# Patient Record
Sex: Female | Born: 1970 | Race: Black or African American | Hispanic: No | Marital: Married | Smoking: Never smoker
Health system: Southern US, Community
[De-identification: ages and names within clinical notes are randomized; demographics above are authoritative.]

## PROBLEM LIST (undated history)

## (undated) DIAGNOSIS — R42 Dizziness and giddiness: Secondary | ICD-10-CM

## (undated) DIAGNOSIS — C50919 Malignant neoplasm of unspecified site of unspecified female breast: Secondary | ICD-10-CM

## (undated) DIAGNOSIS — I10 Essential (primary) hypertension: Secondary | ICD-10-CM

## (undated) DIAGNOSIS — D649 Anemia, unspecified: Secondary | ICD-10-CM

## (undated) HISTORY — PX: LAPAROSCOPIC GASTRIC SLEEVE RESECTION: SHX5895

## (undated) HISTORY — PX: KNEE SURGERY: SHX244

## (undated) HISTORY — PX: BREAST LUMPECTOMY: SHX2

## (undated) HISTORY — PX: TONSILLECTOMY: SUR1361

---

## 2011-12-13 ENCOUNTER — Encounter (HOSPITAL_BASED_OUTPATIENT_CLINIC_OR_DEPARTMENT_OTHER): Payer: Self-pay | Admitting: *Deleted

## 2011-12-13 ENCOUNTER — Emergency Department (HOSPITAL_BASED_OUTPATIENT_CLINIC_OR_DEPARTMENT_OTHER)
Admission: EM | Admit: 2011-12-13 | Discharge: 2011-12-14 | Disposition: A | Discharge status: Home / Self Care | Payer: Self-pay | Attending: Emergency Medicine | Admitting: Emergency Medicine

## 2011-12-13 DIAGNOSIS — R209 Unspecified disturbances of skin sensation: Secondary | ICD-10-CM | POA: Insufficient documentation

## 2011-12-13 DIAGNOSIS — K089 Disorder of teeth and supporting structures, unspecified: Secondary | ICD-10-CM | POA: Insufficient documentation

## 2011-12-13 DIAGNOSIS — K0889 Other specified disorders of teeth and supporting structures: Secondary | ICD-10-CM

## 2011-12-13 DIAGNOSIS — I10 Essential (primary) hypertension: Secondary | ICD-10-CM | POA: Insufficient documentation

## 2011-12-13 HISTORY — DX: Essential (primary) hypertension: I10

## 2011-12-13 NOTE — ED Notes (Signed)
Pt c/o tooth pain in upper left that started tonight. Also c/o intermittent right hand numbness that started this morning

## 2011-12-14 ENCOUNTER — Encounter (HOSPITAL_BASED_OUTPATIENT_CLINIC_OR_DEPARTMENT_OTHER): Payer: Self-pay | Admitting: Emergency Medicine

## 2011-12-14 MED ORDER — OXYCODONE-ACETAMINOPHEN 5-325 MG PO TABS: 1.0000 | ORAL_TABLET | ORAL | Status: AC | PRN

## 2011-12-14 MED ORDER — OXYCODONE-ACETAMINOPHEN 5-325 MG PO TABS
1.0000 | ORAL_TABLET | Freq: Once | ORAL | Status: AC
  Administered 2011-12-14: 1 via ORAL
  Filled 2011-12-14: qty 1

## 2011-12-14 MED ORDER — PENICILLIN V POTASSIUM 500 MG PO TABS: 500.0000 mg | ORAL_TABLET | Freq: Four times a day (QID) | ORAL | Status: AC

## 2011-12-14 MED ORDER — PENICILLIN V POTASSIUM 250 MG PO TABS
500.0000 mg | ORAL_TABLET | Freq: Four times a day (QID) | ORAL | Status: DC
  Administered 2011-12-14: 500 mg via ORAL
  Filled 2011-12-14: qty 2

## 2011-12-14 NOTE — Discharge Instructions (Signed)
Toothache Toothaches are usually caused by tooth decay (cavity). However, other causes of toothache include:  Gum disease.   Cracked tooth.   Cracked filling.   Injury.   Jaw problem (temporo mandibular joint or TMJ disorder).   Tooth abscess.   Root sensitivity.   Grinding.   Eruption problems.  Swelling and redness around a painful tooth often means you have a dental abscess. Pain medicine and antibiotics can help reduce symptoms, but you will need to see a dentist within the next few days to have your problem properly evaluated and treated. If tooth decay is the problem, you may need a filling or root canal to save your tooth. If the problem is more severe, your tooth may need to be pulled. SEEK IMMEDIATE MEDICAL CARE IF:  You cannot swallow.   You develop severe swelling, increased redness, or increased pain in your mouth or face.   You have a fever.   You cannot open your mouth adequately.  Document Released: 09/19/2004 Document Revised: 08/01/2011 Document Reviewed: 11/09/2009 [employer] Patient Information 2012 [employer]. 

## 2011-12-14 NOTE — ED Provider Notes (Signed)
History     CSN: [id number]  Arrival date & time 12/13/11  2308   First MD Initiated Contact with Patient 12/14/11 0024      Chief Complaint  Patient presents with  . Dental Pain    (Consider location/radiation/quality/duration/timing/severity/associated sxs/prior treatment) Patient is a 41 y.o. female presenting with tooth pain. The history is provided by the patient. No language interpreter was used.  Dental PainThe primary symptoms include mouth pain. Primary symptoms do not include fever. The symptoms began 12 to 24 hours ago (She has a severely decayed left upper second bicuspid tooth that has become painful and swollen today.  She had been previously advised that this tooth would need to be extracted.). The symptoms are worsening. The symptoms occur constantly.  Additional symptoms include: dental sensitivity to temperature, gum swelling and gum tenderness.    Past Medical History  Diagnosis Date  . Hypertension     Past Surgical History  Procedure Date  . Tonsillectomy     No family history on file.  History  Substance Use Topics  . Smoking status: Never Smoker   . Smokeless tobacco: Not on file  . Alcohol Use: No    OB History    Grav Para Term Preterm Abortions TAB SAB Ect Mult Living                  Review of Systems  Constitutional: Negative for fever and chills.  HENT: Positive for dental problem.   Eyes: Negative.   Respiratory: Negative.   Cardiovascular:       History of hypertension.  Gastrointestinal: Negative.   Genitourinary: Negative.   Musculoskeletal: Negative.   Skin: Negative.   Neurological: Positive for numbness.       She has noted some numbness in the right hand today.  Psychiatric/Behavioral: Negative.     Allergies  Review of patient's allergies indicates no known allergies.  Home Medications   Current Outpatient Rx  Name Route Sig Dispense Refill  . OXYCODONE-ACETAMINOPHEN 5-325 MG PO TABS Oral Take 1 tablet by mouth  every 4 (four) hours as needed for pain. 20 tablet 0  . PENICILLIN V POTASSIUM 500 MG PO TABS Oral Take 1 tablet (500 mg total) by mouth 4 (four) times daily. 40 tablet 0    BP 170/115  Pulse 106  Temp(Src) 98.8 F (37.1 C) (Oral)  Resp 16  SpO2 100%  LMP 12/02/2011  Physical Exam  Nursing note and vitals reviewed. Constitutional: She is oriented to person, place, and time.       In moderate distress with dental pain; noted to be hypertensive.  HENT:  Head: Normocephalic and atraumatic.  Right Ear: External ear normal.  Left Ear: External ear normal.  Nose: Nose normal.       She has a severely decayed left upper second bicuspid tooth, decayed down to the gum line.  The gingiva around this tooth is swollen and red and tender to palpation.   Eyes: Conjunctivae and EOM are normal. Pupils are equal, round, and reactive to light.  Neck: Normal range of motion. Neck supple.  Cardiovascular: Normal rate, regular rhythm and normal heart sounds.   Pulmonary/Chest: Effort normal and breath sounds normal.  Abdominal: Soft. There is no tenderness.  Musculoskeletal: Normal range of motion.  Neurological: She is alert and oriented to person, place, and time.       No sensory or motor deficit.  No objective sensory loss in right hand.  Skin: Skin is warm  and dry.  Psychiatric: She has a normal mood and affect. Her behavior is normal.    ED Course  Procedures (including critical care time)  Labs Reviewed - No data to display No results found.   1. Toothache      DISP:  I advised pt to take PenVK 500 mg qid x 10 days and Percocet q4h prn pain.  She will need dental extraction of her severely decayed tooth.       Carleene Cooper III, MD 12/14/11 1311

## 2014-02-28 ENCOUNTER — Encounter (HOSPITAL_BASED_OUTPATIENT_CLINIC_OR_DEPARTMENT_OTHER): Payer: Self-pay | Admitting: Emergency Medicine

## 2014-02-28 ENCOUNTER — Emergency Department (HOSPITAL_BASED_OUTPATIENT_CLINIC_OR_DEPARTMENT_OTHER): Payer: BC Managed Care – PPO

## 2014-02-28 ENCOUNTER — Emergency Department (HOSPITAL_BASED_OUTPATIENT_CLINIC_OR_DEPARTMENT_OTHER)
Admission: EM | Admit: 2014-02-28 | Discharge: 2014-02-28 | Disposition: A | Discharge status: Home / Self Care | Payer: BC Managed Care – PPO | Attending: Emergency Medicine | Admitting: Emergency Medicine

## 2014-02-28 DIAGNOSIS — R0789 Other chest pain: Secondary | ICD-10-CM | POA: Insufficient documentation

## 2014-02-28 DIAGNOSIS — Z79899 Other long term (current) drug therapy: Secondary | ICD-10-CM | POA: Insufficient documentation

## 2014-02-28 DIAGNOSIS — E669 Obesity, unspecified: Secondary | ICD-10-CM | POA: Insufficient documentation

## 2014-02-28 DIAGNOSIS — I10 Essential (primary) hypertension: Secondary | ICD-10-CM | POA: Insufficient documentation

## 2014-02-28 DIAGNOSIS — R079 Chest pain, unspecified: Secondary | ICD-10-CM

## 2014-02-28 LAB — COMPREHENSIVE METABOLIC PANEL
ALBUMIN: 3.9 g/dL (ref 3.5–5.2)
ALT: 11 U/L (ref 0–35)
AST: 17 U/L (ref 0–37)
Alkaline Phosphatase: 88 U/L (ref 39–117)
Anion gap: 13 (ref 5–15)
BUN: 14 mg/dL (ref 6–23)
CALCIUM: 8.8 mg/dL (ref 8.4–10.5)
CO2: 23 mEq/L (ref 19–32)
CREATININE: 1 mg/dL (ref 0.50–1.10)
Chloride: 106 mEq/L (ref 96–112)
GFR calc Af Amer: 78 mL/min — ABNORMAL LOW (ref >90)
GFR calc non Af Amer: 67 mL/min — ABNORMAL LOW (ref >90)
Glucose, Bld: 108 mg/dL — ABNORMAL HIGH (ref 70–99)
Potassium: 2.9 mEq/L — CL (ref 3.7–5.3)
Sodium: 142 mEq/L (ref 137–147)
Total Bilirubin: 0.5 mg/dL (ref 0.3–1.2)
Total Protein: 7.4 g/dL (ref 6.0–8.3)

## 2014-02-28 LAB — CBC WITH DIFFERENTIAL/PLATELET
BASOS ABS: 0 10*3/uL (ref 0.0–0.1)
BASOS PCT: 0 % (ref 0–1)
EOS ABS: 0.2 10*3/uL (ref 0.0–0.7)
EOS PCT: 2 % (ref 0–5)
HEMATOCRIT: 32.9 % — AB (ref 36.0–46.0)
Hemoglobin: 10.8 g/dL — ABNORMAL LOW (ref 12.0–15.0)
Lymphocytes Relative: 24 % (ref 12–46)
Lymphs Abs: 2.1 10*3/uL (ref 0.7–4.0)
MCH: 27.5 pg (ref 26.0–34.0)
MCHC: 32.8 g/dL (ref 30.0–36.0)
MCV: 83.7 fL (ref 78.0–100.0)
MONO ABS: 0.6 10*3/uL (ref 0.1–1.0)
Monocytes Relative: 7 % (ref 3–12)
Neutro Abs: 5.7 10*3/uL (ref 1.7–7.7)
Neutrophils Relative %: 66 % (ref 43–77)
Platelets: 241 10*3/uL (ref 150–400)
RBC: 3.93 MIL/uL (ref 3.87–5.11)
RDW: 15.6 % — AB (ref 11.5–15.5)
WBC: 8.6 10*3/uL (ref 4.0–10.5)

## 2014-02-28 LAB — TROPONIN I

## 2014-02-28 MED ORDER — POTASSIUM CHLORIDE CRYS ER 20 MEQ PO TBCR
40.0000 meq | EXTENDED_RELEASE_TABLET | Freq: Once | ORAL | Status: AC
  Administered 2014-02-28: 40 meq via ORAL
  Filled 2014-02-28: qty 2

## 2014-02-28 NOTE — ED Notes (Signed)
Right arm weakness and chest heaviness that started this am.  She reports arm weakness has been intermittent x 2 months.

## 2014-02-28 NOTE — ED Notes (Signed)
MD at bedside. 

## 2014-02-28 NOTE — ED Provider Notes (Signed)
CSN: [id number]     Arrival date & time 02/28/14  0845 History   First MD Initiated Contact with Patient 02/28/14 [phone]     Chief Complaint  Patient presents with  . Extremity Weakness     (Consider location/radiation/quality/duration/timing/severity/associated sxs/prior Treatment) HPI Comments: Patient is a 43 year old female with past medical history of obesity and hypertension. She presents today with complaints of heaviness in her chest that started approximately 2 hours prior to arrival. She denies any shortness of breath, nausea, diaphoresis, or radiation to the arm or jaw. She denies any exertional symptoms. She does not smoke and has no family history of cardiac disease.  The history is provided by the patient.    Past Medical History  Diagnosis Date  . Hypertension    Past Surgical History  Procedure Laterality Date  . Tonsillectomy     No family history on file. History  Substance Use Topics  . Smoking status: Never Smoker   . Smokeless tobacco: Not on file  . Alcohol Use: No   OB History   Grav Para Term Preterm Abortions TAB SAB Ect Mult Living                 Review of Systems  All other systems reviewed and are negative.     Allergies  Review of patient's allergies indicates no known allergies.  Home Medications   Prior to Admission medications   Medication Sig Start Date End Date Taking? Authorizing Provider  triamterene-hydrochlorothiazide (DYAZIDE) 37.5-25 MG per capsule Take 1 capsule by mouth daily.   Yes Historical Provider, MD  hydrochlorothiazide (HYDRODIURIL) 25 MG tablet Take 25 mg by mouth daily.    Historical Provider, MD  metoprolol succinate (TOPROL-XL) 50 MG 24 hr tablet Take 50 mg by mouth daily. Take with or immediately following a meal.    Historical Provider, MD   BP 173/107  Pulse 89  Temp(Src) 98.7 F (37.1 C) (Oral)  Resp 18  Ht 5\' 1"  (1.549 m)  Wt 267 lb (121.11 kg)  BMI 50.48 kg/m2  SpO2 99%  LMP 02/26/2014 Physical  Exam  Nursing note and vitals reviewed. Constitutional: She is oriented to person, place, and time. She appears well-developed and well-nourished. No distress.  HENT:  Head: Normocephalic and atraumatic.  Neck: Normal range of motion. Neck supple.  Cardiovascular: Normal rate and regular rhythm.  Exam reveals no gallop and no friction rub.   No murmur heard. Pulmonary/Chest: Effort normal and breath sounds normal. No respiratory distress. She has no wheezes.  Abdominal: Soft. Bowel sounds are normal. She exhibits no distension. There is no tenderness.  Musculoskeletal: Normal range of motion.  Neurological: She is alert and oriented to person, place, and time.  Skin: Skin is warm and dry. She is not diaphoretic.    ED Course  Procedures (including critical care time) Labs Review Labs Reviewed - No data to display  Imaging Review No results found.   EKG Interpretation   Date/Time:  Monday February 28 2014 09:12:06 EDT Ventricular Rate:  87 PR Interval:  168 QRS Duration: 94 QT Interval:  392 QTC Calculation: 471 R Axis:   3 Text Interpretation:  Normal sinus rhythm Normal ECG Confirmed by DELOS   MD, Vincen Bejar ([id number]) on 02/28/2014 9:18:25 AM      MDM   Final diagnoses:  None    Patient is a 43 year old female who presents with complaints of heaviness in her chest that started this morning. Her EKG is normal and initial troponin  is negative. Her symptoms are atypical for cardiac pain and I strongly doubt a cardiac etiology. She has no significant family history, does not smoke, and her only risk factor is hypertension. She is having no exertional symptoms.  I suspect a musculoskeletal etiology, or other noncardiac etiology. She will be discharged to home and advised to followup with her primary Dr. if not improving in the next several days. She is to return if her symptoms substantially worsen or change.    Veryl Speak, MD 02/28/14 [phone]

## 2014-02-28 NOTE — Discharge Instructions (Signed)
Ibuprofen 600 mg every 6 hours as needed for pain.  Follow up with your primary Dr. if not improving in the next several days and return to the ER if your symptoms substantially worsen or change.   Chest Pain (Nonspecific) It is often hard to give a specific diagnosis for the cause of chest pain. There is always a chance that your pain could be related to something serious, such as a heart attack or a blood clot in the lungs. You need to follow up with your health care provider for further evaluation. CAUSES   Heartburn.  Pneumonia or bronchitis.  Anxiety or stress.  Inflammation around your heart (pericarditis) or lung (pleuritis or pleurisy).  A blood clot in the lung.  A collapsed lung (pneumothorax). It can develop suddenly on its own (spontaneous pneumothorax) or from trauma to the chest.  Shingles infection (herpes zoster virus). The chest wall is composed of bones, muscles, and cartilage. Any of these can be the source of the pain.  The bones can be bruised by injury.  The muscles or cartilage can be strained by coughing or overwork.  The cartilage can be affected by inflammation and become sore (costochondritis). DIAGNOSIS  Lab tests or other studies may be needed to find the cause of your pain. Your health care provider may have you take a test called an ambulatory electrocardiogram (ECG). An ECG records your heartbeat patterns over a 24-hour period. You may also have other tests, such as:  Transthoracic echocardiogram (TTE). During echocardiography, sound waves are used to evaluate how blood flows through your heart.  Transesophageal echocardiogram (TEE).  Cardiac monitoring. This allows your health care provider to monitor your heart rate and rhythm in real time.  Holter monitor. This is a portable device that records your heartbeat and can help diagnose heart arrhythmias. It allows your health care provider to track your heart activity for several days, if  needed.  Stress tests by exercise or by giving medicine that makes the heart beat faster. TREATMENT   Treatment depends on what may be causing your chest pain. Treatment may include:  Acid blockers for heartburn.  Anti-inflammatory medicine.  Pain medicine for inflammatory conditions.  Antibiotics if an infection is present.  You may be advised to change lifestyle habits. This includes stopping smoking and avoiding alcohol, caffeine, and chocolate.  You may be advised to keep your head raised (elevated) when sleeping. This reduces the chance of acid going backward from your stomach into your esophagus. Most of the time, nonspecific chest pain will improve within 2-3 days with rest and mild pain medicine.  HOME CARE INSTRUCTIONS   If antibiotics were prescribed, take them as directed. Finish them even if you start to feel better.  For the next few days, avoid physical activities that bring on chest pain. Continue physical activities as directed.  Do not use any tobacco products, including cigarettes, chewing tobacco, or electronic cigarettes.  Avoid drinking alcohol.  Only take medicine as directed by your health care provider.  Follow your health care provider's suggestions for further testing if your chest pain does not go away.  Keep any follow-up appointments you made. If you do not go to an appointment, you could develop lasting (chronic) problems with pain. If there is any problem keeping an appointment, call to reschedule. SEEK MEDICAL CARE IF:   Your chest pain does not go away, even after treatment.  You have a rash with blisters on your chest.  You have a fever.  SEEK IMMEDIATE MEDICAL CARE IF:   You have increased chest pain or pain that spreads to your arm, neck, jaw, back, or abdomen.  You have shortness of breath.  You have an increasing cough, or you cough up blood.  You have severe back or abdominal pain.  You feel nauseous or vomit.  You have severe  weakness.  You faint.  You have chills. This is an emergency. Do not wait to see if the pain will go away. Get medical help at once. Call your local emergency services (911 in [place] Do not drive yourself to the hospital. MAKE SURE YOU:   Understand these instructions.  Will watch your condition.  Will get help right away if you are not doing well or get worse. Document Released: 05/22/2005 Document Revised: 08/17/2013 Document Reviewed: 03/17/2008 [facility] Patient Information 2015 [place], [place]. This information is not intended to replace advice given to you by your health care provider. Make sure you discuss any questions you have with your health care provider.

## 2016-06-11 ENCOUNTER — Emergency Department (HOSPITAL_BASED_OUTPATIENT_CLINIC_OR_DEPARTMENT_OTHER): Payer: BC Managed Care – PPO

## 2016-06-11 ENCOUNTER — Encounter (HOSPITAL_BASED_OUTPATIENT_CLINIC_OR_DEPARTMENT_OTHER): Payer: Self-pay

## 2016-06-11 ENCOUNTER — Emergency Department (HOSPITAL_BASED_OUTPATIENT_CLINIC_OR_DEPARTMENT_OTHER)
Admission: EM | Admit: 2016-06-11 | Discharge: 2016-06-11 | Disposition: A | Discharge status: Home / Self Care | Payer: BC Managed Care – PPO | Attending: Emergency Medicine | Admitting: Emergency Medicine

## 2016-06-11 ENCOUNTER — Emergency Department (HOSPITAL_COMMUNITY): Payer: BC Managed Care – PPO

## 2016-06-11 DIAGNOSIS — Z79899 Other long term (current) drug therapy: Secondary | ICD-10-CM | POA: Insufficient documentation

## 2016-06-11 DIAGNOSIS — R42 Dizziness and giddiness: Secondary | ICD-10-CM | POA: Diagnosis not present

## 2016-06-11 DIAGNOSIS — I10 Essential (primary) hypertension: Secondary | ICD-10-CM | POA: Diagnosis not present

## 2016-06-11 DIAGNOSIS — D649 Anemia, unspecified: Secondary | ICD-10-CM | POA: Insufficient documentation

## 2016-06-11 DIAGNOSIS — R51 Headache: Secondary | ICD-10-CM | POA: Diagnosis not present

## 2016-06-11 HISTORY — DX: Dizziness and giddiness: R42

## 2016-06-11 HISTORY — DX: Anemia, unspecified: D64.9

## 2016-06-11 LAB — CBC WITH DIFFERENTIAL/PLATELET
Basophils Absolute: 0 10*3/uL (ref 0.0–0.1)
Basophils Relative: 0 %
EOS ABS: 0.2 10*3/uL (ref 0.0–0.7)
EOS PCT: 2 %
HCT: 28.1 % — ABNORMAL LOW (ref 36.0–46.0)
Hemoglobin: 8.5 g/dL — ABNORMAL LOW (ref 12.0–15.0)
LYMPHS ABS: 1.6 10*3/uL (ref 0.7–4.0)
Lymphocytes Relative: 20 %
MCH: 24.8 pg — AB (ref 26.0–34.0)
MCHC: 30.2 g/dL (ref 30.0–36.0)
MCV: 81.9 fL (ref 78.0–100.0)
MONO ABS: 0.6 10*3/uL (ref 0.1–1.0)
Monocytes Relative: 7 %
Neutro Abs: 5.5 10*3/uL (ref 1.7–7.7)
Neutrophils Relative %: 71 %
PLATELETS: 280 10*3/uL (ref 150–400)
RBC: 3.43 MIL/uL — AB (ref 3.87–5.11)
RDW: 17.2 % — AB (ref 11.5–15.5)
WBC: 7.9 10*3/uL (ref 4.0–10.5)

## 2016-06-11 LAB — BASIC METABOLIC PANEL
Anion gap: 8 (ref 5–15)
BUN: 20 mg/dL (ref 6–20)
CHLORIDE: 107 mmol/L (ref 101–111)
CO2: 22 mmol/L (ref 22–32)
CREATININE: 1.17 mg/dL — AB (ref 0.44–1.00)
Calcium: 8.6 mg/dL — ABNORMAL LOW (ref 8.9–10.3)
GFR calc Af Amer: >60 mL/min (ref >60)
GFR, EST NON AFRICAN AMERICAN: 55 mL/min — AB (ref >60)
Glucose, Bld: 103 mg/dL — ABNORMAL HIGH (ref 65–99)
Potassium: 3.3 mmol/L — ABNORMAL LOW (ref 3.5–5.1)
SODIUM: 137 mmol/L (ref 135–145)

## 2016-06-11 LAB — CBG MONITORING, ED: GLUCOSE-CAPILLARY: 102 mg/dL — AB (ref 65–99)

## 2016-06-11 LAB — POC OCCULT BLOOD, ED: Fecal Occult Bld: NEGATIVE

## 2016-06-11 MED ORDER — FERROUS SULFATE 325 (65 FE) MG PO TABS: 325.0000 mg | ORAL_TABLET | Freq: Every day | ORAL | 0 refills | Status: DC

## 2016-06-11 MED ORDER — MECLIZINE HCL 25 MG PO TABS: 25.0000 mg | ORAL_TABLET | Freq: Three times a day (TID) | ORAL | 0 refills | Status: DC | PRN

## 2016-06-11 MED ORDER — POTASSIUM CHLORIDE CRYS ER 20 MEQ PO TBCR
40.0000 meq | EXTENDED_RELEASE_TABLET | Freq: Once | ORAL | Status: AC
  Administered 2016-06-11: 40 meq via ORAL
  Filled 2016-06-11: qty 2

## 2016-06-11 MED ORDER — MECLIZINE HCL 25 MG PO TABS
25.0000 mg | ORAL_TABLET | Freq: Once | ORAL | Status: AC
  Administered 2016-06-11: 25 mg via ORAL
  Filled 2016-06-11: qty 1

## 2016-06-11 MED ORDER — SODIUM CHLORIDE 0.9 % IV BOLUS (SEPSIS)
1000.0000 mL | Freq: Once | INTRAVENOUS | Status: AC
  Administered 2016-06-11: 1000 mL via INTRAVENOUS

## 2016-06-11 NOTE — ED Notes (Signed)
Patient transported to MRI 
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
## 2016-06-11 NOTE — ED Notes (Signed)
Pt transferring to [name] for MRI/MRV; she will be going by POV; Dr. Regenia Skeeter ok with pt keeping IV (saline lock) in place. Pt verbalized she understood she is not to use IV.

## 2016-06-11 NOTE — ED Triage Notes (Addendum)
Pt keeping head in bowed position-states dizziness is worse when she looks up-dx with vertigo approx 1-2 years ago with no reoccurrence-discussed case with EDP [place]-orders placed

## 2016-06-11 NOTE — ED Provider Notes (Signed)
[facility] DEPT Provider Note   CSN: [id number] Arrival date & time: 06/11/16  1649     History   Chief Complaint Chief Complaint  Patient presents with  . Dizziness    HPI Kamilah Beier is a 45 y.o. female.   Dizziness  Quality:  Lightheadedness and vertigo Severity:  Moderate Onset quality:  Sudden Timing:  Constant Progression:  Resolved Chronicity:  New Context: head movement   Relieved by:  Medication Worsened by:  Nothing Associated symptoms: no blood in stool, no chest pain, no diarrhea, no nausea, no palpitations, no shortness of breath, no vomiting and no weakness   Risk factors: hx of vertigo     Past Medical History:  Diagnosis Date  . Anemia   . Hypertension   . Vertigo     There are no active problems to display for this patient.   Past Surgical History:  Procedure Laterality Date  . TONSILLECTOMY      OB History    No data available       Home Medications    Prior to Admission medications   Medication Sig Start Date End Date Taking? Authorizing Provider  meclizine (ANTIVERT) 25 MG tablet Take 1 tablet (25 mg total) by mouth 3 (three) times daily as needed for dizziness. 06/11/16   Sherwood Gambler, MD  triamterene-hydrochlorothiazide (DYAZIDE) 37.5-25 MG per capsule Take 1 capsule by mouth daily.    Historical Provider, MD    Family History No family history on file.  Social History Social History  Substance Use Topics  . Smoking status: Never Smoker  . Smokeless tobacco: Never Used  . Alcohol use No     Allergies   Review of patient's allergies indicates no known allergies.   Review of Systems Review of Systems  Constitutional: Negative for chills and fever.  HENT: Negative for ear pain and sore throat.   Eyes: Negative for pain and visual disturbance.  Respiratory: Negative for cough and shortness of breath.   Cardiovascular: Negative for chest pain and palpitations.  Gastrointestinal: Negative for abdominal  pain, blood in stool, diarrhea, nausea and vomiting.  Genitourinary: Positive for vaginal bleeding (currently mild intermittent spotting. 2 weeks ago heavy flow. Followed by ob/gyn.). Negative for dysuria and hematuria.  Musculoskeletal: Negative for arthralgias and back pain.  Skin: Negative for color change and rash.  Neurological: Positive for dizziness. Negative for seizures, syncope and weakness.  All other systems reviewed and are negative.    Physical Exam Updated Vital Signs BP 169/99   Pulse 93   Temp 98.2 F (36.8 C) (Oral)   Resp 16   Ht 5\' 1"  (1.549 m)   Wt 120.2 kg   SpO2 100%   BMI 50.07 kg/m   Physical Exam  Constitutional: She is oriented to person, place, and time. She appears well-developed and well-nourished.  HENT:  Head: Normocephalic and atraumatic.  Eyes: Conjunctivae and EOM are normal. Pupils are equal, round, and reactive to light.  Neck: Normal range of motion. Neck supple.  Cardiovascular: Normal rate and regular rhythm.   Pulmonary/Chest: Effort normal and breath sounds normal.  Abdominal: Soft. There is no tenderness.  Musculoskeletal: She exhibits no edema.  Neurological: She is alert and oriented to person, place, and time. No cranial nerve deficit. She exhibits normal muscle tone. Coordination normal.  Skin: Skin is warm and dry.  Psychiatric: She has a normal mood and affect.  Nursing note and vitals reviewed.    ED Treatments / Results  Labs (all  labs ordered are listed, but only abnormal results are displayed) Labs Reviewed  BASIC METABOLIC PANEL - Abnormal; Notable for the following:       Result Value   Potassium 3.3 (*)    Glucose, Bld 103 (*)    Creatinine, Ser 1.17 (*)    Calcium 8.6 (*)    GFR calc non Af Amer 55 (*)    All other components within normal limits  CBC WITH DIFFERENTIAL/PLATELET - Abnormal; Notable for the following:    RBC 3.43 (*)    Hemoglobin 8.5 (*)    HCT 28.1 (*)    MCH 24.8 (*)    RDW 17.2 (*)     All other components within normal limits  CBG MONITORING, ED - Abnormal; Notable for the following:    Glucose-Capillary 102 (*)    All other components within normal limits  POC OCCULT BLOOD, ED    EKG  EKG Interpretation  Date/Time:  Tuesday June 11 2016 17:10:30 EDT Ventricular Rate:  86 PR Interval:  160 QRS Duration: 82 QT Interval:  374 QTC Calculation: 447 R Axis:   41 Text Interpretation:  Normal sinus rhythm Possible Anterior infarct , age undetermined Abnormal ECG no significant change since July 2015 Confirmed by Regenia Skeeter MD, SCOTT [phone]) on 06/11/2016 5:16:56 PM       Radiology Ct Head Wo Contrast  Result Date: 06/11/2016 CLINICAL DATA:  Subacute onset of headache.  Initial encounter. EXAM: CT HEAD WITHOUT CONTRAST TECHNIQUE: Contiguous axial images were obtained from the base of the skull through the vertex without intravenous contrast. COMPARISON:  None. FINDINGS: Brain: No evidence of acute infarction, hemorrhage, hydrocephalus, extra-axial collection or mass lesion/mass effect. The posterior fossa, including the cerebellum, brainstem and fourth ventricle, is within normal limits. The third and lateral ventricles, and basal ganglia are unremarkable in appearance. The cerebral hemispheres are symmetric in appearance, with normal gray-white differentiation. No mass effect or midline shift is seen. Vascular: No hyperdense vessel or unexpected calcification. Skull: There is no evidence of fracture; visualized osseous structures are unremarkable in appearance. Sinuses/Orbits: The orbits are within normal limits. The paranasal sinuses and mastoid air cells are well-aerated. Other: No significant soft tissue abnormalities are seen. IMPRESSION: Unremarkable noncontrast CT of the head. Electronically Signed   By: Garald Balding M.D.   On: 06/11/2016 18:22   Mr Brain Wo Contrast  Result Date: 06/11/2016 CLINICAL DATA:  Dizziness EXAM: MRI HEAD WITHOUT CONTRAST MRV HEAD  WITHOUT CONTRAST TECHNIQUE: Multiplanar, multiecho pulse sequences of the brain and surrounding structures were obtained without intravenous contrast. Angiographic images of the intracranial venous structures were obtained using MRV technique without intravenous contrast. COMPARISON:  Head CT 06/11/2016 FINDINGS: MR BRAIN Brain: No acute infarct or intraparenchymal hemorrhage. The midline structures are normal. No focal parenchymal signal abnormality. No mass lesion or midline shift. No hydrocephalus or extra-axial fluid collection. Vascular: Major intracranial arterial and venous sinus flow voids are preserved. No evidence of chronic microhemorrhage or amyloid angiopathy. Skull and upper cervical spine: The visualized skull base, calvarium, upper cervical spine and extracranial soft tissues are normal. Sinuses/Orbits: No fluid levels or advanced mucosal thickening. No mastoid effusion. Normal orbits. MR VENOGRAM Superior sagittal sinus: Normal. Straight sinus: Normal. Inferior sagittal sinus, vein of Galen and internal cerebral veins: Normal. Transverse sinuses: There is a normal variant hypoplastic left transverse sinus. Right transverse sinus is normal. Sigmoid sinuses: Normal. Visualized jugular veins: Normal. IMPRESSION: Normal MRI/MRV of the brain. Electronically Signed   By: Ulyses Jarred  M.D.   On: 06/11/2016 22:25   Mr Mrv Head Wo Cm  Result Date: 06/11/2016 CLINICAL DATA:  Dizziness EXAM: MRI HEAD WITHOUT CONTRAST MRV HEAD WITHOUT CONTRAST TECHNIQUE: Multiplanar, multiecho pulse sequences of the brain and surrounding structures were obtained without intravenous contrast. Angiographic images of the intracranial venous structures were obtained using MRV technique without intravenous contrast. COMPARISON:  Head CT 06/11/2016 FINDINGS: MR BRAIN Brain: No acute infarct or intraparenchymal hemorrhage. The midline structures are normal. No focal parenchymal signal abnormality. No mass lesion or midline  shift. No hydrocephalus or extra-axial fluid collection. Vascular: Major intracranial arterial and venous sinus flow voids are preserved. No evidence of chronic microhemorrhage or amyloid angiopathy. Skull and upper cervical spine: The visualized skull base, calvarium, upper cervical spine and extracranial soft tissues are normal. Sinuses/Orbits: No fluid levels or advanced mucosal thickening. No mastoid effusion. Normal orbits. MR VENOGRAM Superior sagittal sinus: Normal. Straight sinus: Normal. Inferior sagittal sinus, vein of Galen and internal cerebral veins: Normal. Transverse sinuses: There is a normal variant hypoplastic left transverse sinus. Right transverse sinus is normal. Sigmoid sinuses: Normal. Visualized jugular veins: Normal. IMPRESSION: Normal MRI/MRV of the brain. Electronically Signed   By: Ulyses Jarred M.D.   On: 06/11/2016 22:25    Procedures Procedures (including critical care time)  Medications Ordered in ED Medications  sodium chloride 0.9 % bolus 1,000 mL (0 mLs Intravenous Stopped 06/11/16 1927)  meclizine (ANTIVERT) tablet 25 mg (25 mg Oral Given 06/11/16 1818)  potassium chloride SA (K-DUR,KLOR-CON) CR tablet 40 mEq (40 mEq Oral Given 06/11/16 1838)     Initial Impression / Assessment and Plan / ED Course  I have reviewed the triage vital signs and the nursing notes.  Pertinent labs & imaging results that were available during my care of the patient were reviewed by me and considered in my medical decision making (see chart for details).  Clinical Course  Comment By Time  Will give fluids, check labs, give antivert. ECG benign. Neuro exam unremarkable except does not feel like she can walk due to too much spinning when I stood her up. Will get CT given she is on depo and headaches x 2 weeks with this. Probably will need MRI to r/o venous sinus thrombosis and/or CVA Sherwood Gambler, MD 10/17 1743  CT head unremarkable.Her anemia seems slightly worse at 8.5. She states  she's had some spotting but no rectal or GI symptoms. Dizziness is now gone after Antivert. Most likely this is just a peripheral vertigo however having she has risk factors for sinus thrombosis and I think MRI/MRV as needed. She wants to transfer to [facility] by POV. Sherwood Gambler, MD 10/17 1900    Ms. Wynetta Emery is a 45 year old female with a past medical history significant for anemia, menorrhagia, hypertension, vertigo who presents for vertigo.  She was initially evaluated at our affiliated emergency department, and consideration was made for posterior stroke.  She was sent here for further evaluation including MRI imaging of brain.  She is asymptomatic after receiving meclizine.  MRI was performed and demonstrates no abnormal findings.  The patient has been followed for menorrhagia and her current hemoglobin level appears to have decreased by 1.5.  She denies any substantial vaginal bleeding in the past 2 weeks.  Hemoccult was performed and is negative.  The patient is encouraged to follow-up with her OB/GYN within the next 2 days for further evaluation and treatment.  The patient is discharged home with strict return precautions, follow up instructions,  and Scientist, clinical (histocompatibility and immunogenetics).   She is given a prescription for meclizine and iron supplement.   Final Clinical Impressions(s) / ED Diagnoses   Final diagnoses:  Vertigo  Anemia, unspecified type  Dizziness    New Prescriptions Discharge Medication List as of 06/11/2016 10:55 PM    START taking these medications   Details  ferrous sulfate 325 (65 FE) MG tablet Take 1 tablet (325 mg total) by mouth daily., Starting Tue 06/11/2016, Print    meclizine (ANTIVERT) 25 MG tablet Take 1 tablet (25 mg total) by mouth 3 (three) times daily as needed for dizziness., Starting Tue 06/11/2016, Print         Elveria Rising, MD 06/12/16 [place], MD 06/13/16 [phone]

## 2016-06-11 NOTE — ED Triage Notes (Signed)
Dizziness started approx 1045am-intermittent but constant x 3 hours-states she went back to work after being out x 3 weeks for anemia-NAD-presents to triage in w/c

## 2016-06-13 ENCOUNTER — Encounter (HOSPITAL_BASED_OUTPATIENT_CLINIC_OR_DEPARTMENT_OTHER): Payer: Self-pay | Admitting: Emergency Medicine

## 2016-10-22 ENCOUNTER — Encounter (HOSPITAL_COMMUNITY): Payer: Self-pay | Admitting: Emergency Medicine

## 2016-10-22 DIAGNOSIS — I10 Essential (primary) hypertension: Secondary | ICD-10-CM | POA: Diagnosis present

## 2016-10-22 LAB — I-STAT TROPONIN, ED: TROPONIN I, POC: 0 ng/mL (ref 0.00–0.08)

## 2016-10-22 NOTE — ED Triage Notes (Signed)
Pt states she is having high BP since the morning taking her medication with no relief,  BP 175/109 PTA to ED. Pt states she was having some numbness and tingling on her hands.

## 2016-10-23 ENCOUNTER — Emergency Department (HOSPITAL_COMMUNITY)
Admission: EM | Admit: 2016-10-23 | Discharge: 2016-10-23 | Disposition: A | Discharge status: Home / Self Care | Payer: BC Managed Care – PPO | Attending: Emergency Medicine | Admitting: Emergency Medicine

## 2016-10-23 DIAGNOSIS — I10 Essential (primary) hypertension: Secondary | ICD-10-CM

## 2016-10-23 LAB — CBC WITH DIFFERENTIAL/PLATELET
BASOS PCT: 0 %
Basophils Absolute: 0 10*3/uL (ref 0.0–0.1)
EOS ABS: 0.3 10*3/uL (ref 0.0–0.7)
Eosinophils Relative: 3 %
HCT: 32.5 % — ABNORMAL LOW (ref 36.0–46.0)
HEMOGLOBIN: 9.8 g/dL — AB (ref 12.0–15.0)
Lymphocytes Relative: 28 %
Lymphs Abs: 2.6 10*3/uL (ref 0.7–4.0)
MCH: 23.7 pg — ABNORMAL LOW (ref 26.0–34.0)
MCHC: 30.2 g/dL (ref 30.0–36.0)
MCV: 78.5 fL (ref 78.0–100.0)
MONOS PCT: 7 %
Monocytes Absolute: 0.7 10*3/uL (ref 0.1–1.0)
NEUTROS PCT: 62 %
Neutro Abs: 5.7 10*3/uL (ref 1.7–7.7)
Platelets: 294 10*3/uL (ref 150–400)
RBC: 4.14 MIL/uL (ref 3.87–5.11)
RDW: 17.1 % — ABNORMAL HIGH (ref 11.5–15.5)
WBC: 9.2 10*3/uL (ref 4.0–10.5)

## 2016-10-23 LAB — BASIC METABOLIC PANEL
ANION GAP: 6 (ref 5–15)
BUN: 13 mg/dL (ref 6–20)
CALCIUM: 8.9 mg/dL (ref 8.9–10.3)
CHLORIDE: 105 mmol/L (ref 101–111)
CO2: 26 mmol/L (ref 22–32)
CREATININE: 0.89 mg/dL (ref 0.44–1.00)
GFR calc non Af Amer: >60 mL/min (ref >60)
Glucose, Bld: 120 mg/dL — ABNORMAL HIGH (ref 65–99)
Potassium: 3.3 mmol/L — ABNORMAL LOW (ref 3.5–5.1)
SODIUM: 137 mmol/L (ref 135–145)

## 2016-10-23 NOTE — Discharge Instructions (Signed)
Call your insurance for a list of primary care physicians that you can see under your coverage. You need to establish with a primary care doctor for management of your high blood pressure. Take your medication as prescribed.

## 2016-10-23 NOTE — ED Provider Notes (Signed)
[facility] DEPT Provider Note   CSN: [id number] Arrival date & time: 10/22/16  2306     History   Chief Complaint Chief Complaint  Patient presents with  . Hypertension    HPI Andrea [name] is a 46 y.o. female.  Patient with a history of HTN presents with concern for elevated blood pressure starting yesterday morning. She "felt like it was high" and found it was 160/91. No chest pain, SOB, headache, visual or LE edema. She admits to noncompliance with her medication. Last dose taken was 5 days ago. She reports she doesn't take it during the day because it increases urination and she drives a bus, and she doesn't like to take it at night due to having to get up to urinate through the night.    The history is provided by the patient. No language interpreter was used.  Hypertension     Past Medical History:  Diagnosis Date  . Anemia   . Hypertension   . Vertigo     There are no active problems to display for this patient.   Past Surgical History:  Procedure Laterality Date  . TONSILLECTOMY      OB History    No data available       Home Medications    Prior to Admission medications   Medication Sig Start Date End Date Taking? Authorizing Provider  Chlorpheniramine-APAP (CORICIDIN) 2-325 MG TABS Take 1 tablet by mouth daily as needed (for cold/flu symptoms).   Yes Historical Provider, MD  triamterene-hydrochlorothiazide (DYAZIDE) 37.5-25 MG per capsule Take 1 capsule by mouth daily.   Yes Historical Provider, MD  ferrous sulfate 325 (65 FE) MG tablet Take 1 tablet (325 mg total) by mouth daily. Patient not taking: Reported on 10/23/2016 06/11/16   Elveria Rising, MD  meclizine (ANTIVERT) 25 MG tablet Take 1 tablet (25 mg total) by mouth 3 (three) times daily as needed for dizziness. Patient not taking: Reported on 10/23/2016 06/11/16   Elveria Rising, MD    Family History History reviewed. No pertinent family history.  Social History Social History    Substance Use Topics  . Smoking status: Never Smoker  . Smokeless tobacco: Never Used  . Alcohol use No     Allergies   Patient has no known allergies.   Review of Systems Review of Systems  Constitutional: Negative for chills and fever.  HENT: Negative.   Respiratory: Negative.   Cardiovascular: Negative.   Gastrointestinal: Negative.   Musculoskeletal: Negative.   Skin: Negative.   Neurological: Negative.      Physical Exam Updated Vital Signs BP (!) 164/106 (BP Location: Right Wrist)   Pulse 89   Temp 98.2 F (36.8 C) (Oral)   Resp 16   Ht 5\' 1"  (1.549 m)   Wt 122.9 kg   SpO2 100%   BMI 51.21 kg/m   Physical Exam  Constitutional: She is oriented to person, place, and time. She appears well-developed and well-nourished.  HENT:  Head: Normocephalic.  Neck: Normal range of motion. Neck supple.  Cardiovascular: Normal rate and regular rhythm.   No murmur heard. Pulmonary/Chest: Effort normal and breath sounds normal. She has no wheezes. She has no rales.  Abdominal: Soft. Bowel sounds are normal. There is no tenderness. There is no rebound and no guarding.  Musculoskeletal: Normal range of motion. She exhibits edema.  Neurological: She is alert and oriented to person, place, and time.  Skin: Skin is warm and dry. No rash noted.  Psychiatric:  She has a normal mood and affect.     ED Treatments / Results  Labs (all labs ordered are listed, but only abnormal results are displayed) Labs Reviewed  CBC WITH DIFFERENTIAL/PLATELET - Abnormal; Notable for the following:       Result Value   Hemoglobin 9.8 (*)    HCT 32.5 (*)    MCH 23.7 (*)    RDW 17.1 (*)    All other components within normal limits  BASIC METABOLIC PANEL - Abnormal; Notable for the following:    Potassium 3.3 (*)    Glucose, Bld 120 (*)    All other components within normal limits  I-STAT TROPOININ, ED   Results for orders placed or performed during the hospital encounter of 10/23/16   CBC with Differential  Result Value Ref Range   WBC 9.2 4.0 - 10.5 K/uL   RBC 4.14 3.87 - 5.11 MIL/uL   Hemoglobin 9.8 (L) 12.0 - 15.0 g/dL   HCT 32.5 (L) 36.0 - 46.0 %   MCV 78.5 78.0 - 100.0 fL   MCH 23.7 (L) 26.0 - 34.0 pg   MCHC 30.2 30.0 - 36.0 g/dL   RDW 17.1 (H) 11.5 - 15.5 %   Platelets 294 150 - 400 K/uL   Neutrophils Relative % 62 %   Neutro Abs 5.7 1.7 - 7.7 K/uL   Lymphocytes Relative 28 %   Lymphs Abs 2.6 0.7 - 4.0 K/uL   Monocytes Relative 7 %   Monocytes Absolute 0.7 0.1 - 1.0 K/uL   Eosinophils Relative 3 %   Eosinophils Absolute 0.3 0.0 - 0.7 K/uL   Basophils Relative 0 %   Basophils Absolute 0.0 0.0 - 0.1 K/uL  Basic metabolic panel  Result Value Ref Range   Sodium 137 135 - 145 mmol/L   Potassium 3.3 (L) 3.5 - 5.1 mmol/L   Chloride 105 101 - 111 mmol/L   CO2 26 22 - 32 mmol/L   Glucose, Bld 120 (H) 65 - 99 mg/dL   BUN 13 6 - 20 mg/dL   Creatinine, Ser 0.89 0.44 - 1.00 mg/dL   Calcium 8.9 8.9 - 10.3 mg/dL   GFR calc non Af Amer >60 >60 mL/min   GFR calc Af Amer >60 >60 mL/min   Anion gap 6 5 - 15  I-Stat Troponin, ED (not at [facility])  Result Value Ref Range   Troponin i, poc 0.00 0.00 - 0.08 ng/mL   Comment 3            EKG  EKG Interpretation None       Radiology No results found.  Procedures Procedures (including critical care time)  Medications Ordered in ED Medications - No data to display   Initial Impression / Assessment and Plan / ED Course  I have reviewed the triage vital signs and the nursing notes.  Pertinent labs & imaging results that were available during my care of the patient were reviewed by me and considered in my medical decision making (see chart for details).     Patient with a history of HTN presents with concern for her blood pressure being elevated. She is asymptomatic. Blood pressure here mildly elevated. She can be discharged home and is encouraged to take her medicine on a regular basis and follow up with PCP.    Final Clinical Impressions(s) / ED Diagnoses   Final diagnoses:  None   1. Hypertension  New Prescriptions New Prescriptions   No medications on file  Charlann Lange, PA-C 10/23/16 [place], DO 10/23/16 [id number]

## 2017-06-09 ENCOUNTER — Emergency Department (HOSPITAL_BASED_OUTPATIENT_CLINIC_OR_DEPARTMENT_OTHER)
Admission: EM | Admit: 2017-06-09 | Discharge: 2017-06-09 | Disposition: A | Discharge status: Home / Self Care | Payer: Worker's Compensation | Attending: Emergency Medicine | Admitting: Emergency Medicine

## 2017-06-09 ENCOUNTER — Emergency Department (HOSPITAL_BASED_OUTPATIENT_CLINIC_OR_DEPARTMENT_OTHER): Payer: Worker's Compensation

## 2017-06-09 ENCOUNTER — Encounter (HOSPITAL_BASED_OUTPATIENT_CLINIC_OR_DEPARTMENT_OTHER): Payer: Self-pay

## 2017-06-09 DIAGNOSIS — I10 Essential (primary) hypertension: Secondary | ICD-10-CM | POA: Diagnosis not present

## 2017-06-09 DIAGNOSIS — Z79899 Other long term (current) drug therapy: Secondary | ICD-10-CM | POA: Diagnosis not present

## 2017-06-09 DIAGNOSIS — M79641 Pain in right hand: Secondary | ICD-10-CM

## 2017-06-09 DIAGNOSIS — M25561 Pain in right knee: Secondary | ICD-10-CM | POA: Insufficient documentation

## 2017-06-09 DIAGNOSIS — W19XXXA Unspecified fall, initial encounter: Secondary | ICD-10-CM

## 2017-06-09 MED ORDER — KETOROLAC TROMETHAMINE 30 MG/ML IJ SOLN
30.0000 mg | Freq: Once | INTRAMUSCULAR | Status: AC
  Administered 2017-06-09: 30 mg via INTRAMUSCULAR
  Filled 2017-06-09: qty 1

## 2017-06-09 NOTE — ED Provider Notes (Signed)
[facility] Provider Note   CSN: [id number] Arrival date & time: 06/09/17  1111     History   Chief Complaint Chief Complaint  Patient presents with  . Fall    HPI Andrea [name] is a 46 y.o. female who presents today with chief complaint acute onset, intermittent left hand pain and acute onset, constant right knee pain secondary to fall earlier today. Patient states she was getting off the bus when she tripped over some acorns on the ground and fell backwards. She states that her right leg bent backwards and she landed on her right knee in a flexed position. She thinks she may have also landed on her outstretched hands. She denies head injury or loss of consciousness. She states that since then she has experienced intermittent sharp pain of the first and second digits of the right hand, no aggravating or alleviating factors noted. She has constant sharp right knee pain which radiates to the proximal shin, worsens with palpation or movement. She has been able to bear weight although it is painful. Denies numbness, tingling, weakness, headache, back pain, or neck pain. Has not tried anything for her symptoms. Did not take her blood pressure medication today.  The history is provided by the patient.    Past Medical History:  Diagnosis Date  . Anemia   . Hypertension   . Vertigo     There are no active problems to display for this patient.   Past Surgical History:  Procedure Laterality Date  . TONSILLECTOMY      OB History    No data available       Home Medications    Prior to Admission medications   Medication Sig Start Date End Date Taking? Authorizing Provider  triamterene-hydrochlorothiazide (DYAZIDE) 37.5-25 MG per capsule Take 1 capsule by mouth daily.    [provider]    Family History No family history on file.  Social History Social History  Substance Use Topics  . Smoking status: Never Smoker  . Smokeless tobacco: Never Used  .  Alcohol use No     Allergies   Strawberry flavor   Review of Systems Review of Systems  Musculoskeletal: Positive for arthralgias (R hand and knee). Negative for back pain and neck pain.  Neurological: Negative for weakness, numbness and headaches.     Physical Exam Updated Vital Signs BP 129/85 (BP Location: Right Arm)   Pulse 94   Temp 98.4 F (36.9 C) (Oral)   Resp 18   LMP 05/28/2017   SpO2 99%   Physical Exam  Constitutional: She is oriented to person, place, and time. She appears well-developed and well-nourished. No distress.  HENT:  Head: Normocephalic and atraumatic.  Eyes: Conjunctivae are normal. Right eye exhibits no discharge. Left eye exhibits no discharge.  Neck: Normal range of motion. Neck supple. No JVD present. No tracheal deviation present.  Cardiovascular: Normal rate and intact distal pulses.   2+ radial and DP/PT pulses bl, negative Homan's bl   Pulmonary/Chest: Effort normal.  Abdominal: She exhibits no distension.  Musculoskeletal: She exhibits no edema.       Right wrist: Normal.       Left wrist: Normal.       Right hip: Normal.       Left hip: Normal.       Right knee: She exhibits decreased range of motion and swelling. She exhibits no ecchymosis, no deformity, no laceration, no erythema, normal alignment, no LCL laxity, no bony  tenderness and normal meniscus. Tenderness found. Medial joint line, lateral joint line, LCL and patellar tendon tenderness noted. No MCL tenderness noted.       Left knee: Normal.       Right ankle: Normal.       Left ankle: Normal.       Right hand: She exhibits tenderness. She exhibits normal range of motion, no bony tenderness, normal two-point discrimination, normal capillary refill, no deformity, no laceration and no swelling. Normal sensation noted. Normal strength noted.  Tender to palpation overlying the right first digit, mild tenderness to palpation of the second digit. No snuffbox tenderness. Normal range  of motion of the wrist and digits. 5/5 strength of wrist and digits with flexion and extension against resistance. Right knee with effusion overlying the telemetry. Quadriceps tendon is intact.  Neurological: She is alert and oriented to person, place, and time.  Fluent speech, no facial droop, sensation intact globally, antalgicgait, but patient able to heel walk and toe walk although painful  Skin: Skin is warm and dry. No erythema.  Psychiatric: She has a normal mood and affect. Her behavior is normal.  Nursing note and vitals reviewed.    ED Treatments / Results  Labs (all labs ordered are listed, but only abnormal results are displayed) Labs Reviewed - No data to display  EKG  EKG Interpretation None       Radiology Dg Knee Complete 4 Views Right  Result Date: 06/09/2017 CLINICAL DATA:  Status post fall.  Knee pain. EXAM: RIGHT KNEE - COMPLETE 4+ VIEW COMPARISON:  None. FINDINGS: There is no joint effusion. Moderate degenerative changes are noted including sharpening the tibial spines and marginal spur formation. No fracture or subluxation identified. IMPRESSION: 1. No acute findings. 2. Osteoarthritis. Electronically Signed   By: Kerby Moors M.D.   On: 06/09/2017 12:33   Dg Hand Complete Right  Result Date: 06/09/2017 CLINICAL DATA:  Patient fell this am and is having rt thumb pain with right all over knee pain EXAM: RIGHT HAND - COMPLETE 3+ VIEW COMPARISON:  None. FINDINGS: There is no evidence of fracture or dislocation. There is no evidence of arthropathy or other focal bone abnormality. Soft tissues are unremarkable. IMPRESSION: No acute osseous injury of the right hand. Electronically Signed   By: Kathreen Devoid   On: 06/09/2017 12:32    Procedures Procedures (including critical care time)  Medications Ordered in ED Medications  ketorolac (TORADOL) 30 MG/ML injection 30 mg (30 mg Intramuscular Given 06/09/17 1307)     Initial Impression / Assessment and Plan / ED  Course  I have reviewed the triage vital signs and the nursing notes.  Pertinent labs & imaging results that were available during my care of the patient were reviewed by me and considered in my medical decision making (see chart for details).     Patient present with right hand pain and right knee pain secondary to fall earlier today. Afebrile, vital signs are stable (Did not take her blood pressure medication earlier today), no focal neurological deficits.she has limited range of motion of the knee secondary to pain but is weightbearing and ambulatory. Radiographs show no acute bony abnormality. She has no snuffbox tenderness, low suspicion of scaphoid fracture.Xrays of the knee show osteoarthritis. Compartment are soft. Pain managed while in the ED. Low suspicion of gout, septic joint, or osteomyelitis in the presence of trauma. Low suspicion of SAH, ICH, or skull fracture in the absence of headache or physical exam findings.  RICE therapy indicated and discussed, will discharge with knee sleeve and crutches She will follow-up with primary care physician or sports medicine for reevaluation. Discussed indications for return to the ED. Pt verbalized understanding of and agreement with plan and is safe for discharge home at this time.   Final Clinical Impressions(s) / ED Diagnoses   Final diagnoses:  Acute pain of right knee  Right hand pain  Fall, initial encounter    New Prescriptions Discharge Medication List as of 06/09/2017  1:01 PM       Renita Papa, PA-C 06/09/17 1737    Jola Schmidt, MD 06/10/17 2133

## 2017-06-09 NOTE — ED Triage Notes (Signed)
Pt states she slipped/fell this am-pain to right hand, right knee-NAD-steady limping gait

## 2017-06-09 NOTE — Discharge Instructions (Signed)
Alternate 600 mg of ibuprofen and [phone] mg of Tylenol every 3 hours as needed for pain. Do not exceed 4000 mg of Tylenol daily. Apply ice or heat for comfort, which ever feels best. Do some gentle stretching during hot showers and baths to avoid muscle stiffness. Use the knee sleeve and crutches for comfort. Follow-up with primary care physician or orthopedics for reevaluation.return to the ED immediately if any concerning signs or symptoms develop such as  severe worsening of pain, weakness, persistent numbness, fevers, swelling, or redness.

## 2017-06-09 NOTE — ED Notes (Signed)
Pt given note for work. Taken to car by EMT

## 2017-06-09 NOTE — ED Notes (Signed)
ED Provider at bedside. Dr. Venora Maples

## 2017-12-19 ENCOUNTER — Emergency Department (HOSPITAL_BASED_OUTPATIENT_CLINIC_OR_DEPARTMENT_OTHER)
Admission: EM | Admit: 2017-12-19 | Discharge: 2017-12-19 | Disposition: A | Discharge status: Home / Self Care | Payer: BC Managed Care – PPO | Attending: Emergency Medicine | Admitting: Emergency Medicine

## 2017-12-19 ENCOUNTER — Other Ambulatory Visit: Payer: Self-pay

## 2017-12-19 ENCOUNTER — Encounter (HOSPITAL_BASED_OUTPATIENT_CLINIC_OR_DEPARTMENT_OTHER): Payer: Self-pay

## 2017-12-19 DIAGNOSIS — I10 Essential (primary) hypertension: Secondary | ICD-10-CM | POA: Diagnosis not present

## 2017-12-19 DIAGNOSIS — L03114 Cellulitis of left upper limb: Secondary | ICD-10-CM | POA: Diagnosis not present

## 2017-12-19 DIAGNOSIS — R2232 Localized swelling, mass and lump, left upper limb: Secondary | ICD-10-CM | POA: Diagnosis present

## 2017-12-19 MED ORDER — CEPHALEXIN 500 MG PO CAPS: 500.0000 mg | ORAL_CAPSULE | Freq: Four times a day (QID) | ORAL | 0 refills | Status: DC

## 2017-12-19 NOTE — ED Triage Notes (Signed)
C/o ?abscess to left elbow x 5 days-NAD-slow gait with own walker

## 2017-12-19 NOTE — ED Provider Notes (Signed)
[facility]PART[facility] Provider Note   CSN: [id number] Arrival date & time: 12/19/17  1130     History   Chief Complaint Chief Complaint  Patient presents with  . Abscess    HPI Andrea [name] is a 47 y.o. female who presents to ED for evaluation of possible abscess to left elbow for the past 5 days.  States that she noticed an area of induration on the elbow.  She applied fat to the area and then warm compress with Epsom salt.  She had her mother and the look at it who believed it was an abscess.  However, yesterday she accidentally hit her elbow on the wall which caused improvement in her pain.  She states that the area has spontaneously drained.  Denies any fevers, history of gout or infected joint, changes to range of motion of elbow, history of diabetes or other immunocompromise state.  HPI  Past Medical History:  Diagnosis Date  . Anemia   . Hypertension   . Vertigo     There are no active problems to display for this patient.   Past Surgical History:  Procedure Laterality Date  . KNEE SURGERY    . TONSILLECTOMY       OB History   None      Home Medications    Prior to Admission medications   Medication Sig Start Date End Date Taking? Authorizing Provider  cephALEXin (KEFLEX) 500 MG capsule Take 1 capsule (500 mg total) by mouth 4 (four) times daily. 12/19/17   Zaia Carre, PA-C  triamterene-hydrochlorothiazide (DYAZIDE) 37.5-25 MG per capsule Take 1 capsule by mouth daily.    [provider]    Family History No family history on file.  Social History Social History   Tobacco Use  . Smoking status: Never Smoker  . Smokeless tobacco: Never Used  Substance Use Topics  . Alcohol use: No  . Drug use: No     Allergies   Strawberry flavor   Review of Systems Review of Systems  Constitutional: Negative for chills and fever.  Musculoskeletal: Negative for arthralgias.  Skin: Positive for color change.    Neurological: Negative for weakness and numbness.     Physical Exam Updated Vital Signs BP (!) 142/93 (BP Location: Right Arm)   Pulse 85   Temp 98.2 F (36.8 C) (Oral)   Resp 20   Ht 5\' 1"  (1.549 m)   Wt 124.8 kg (275 lb 2.2 oz)   LMP 11/24/2017   SpO2 100%   BMI 51.99 kg/m   Physical Exam  Constitutional: She appears well-developed and well-nourished. No distress.  HENT:  Head: Normocephalic and atraumatic.  Eyes: Conjunctivae and EOM are normal. No scleral icterus.  Neck: Normal range of motion.  Pulmonary/Chest: Effort normal. No respiratory distress.  Musculoskeletal: Normal range of motion.  Full active and passive range of motion of elbow without difficulty.  No erythema, edema or warmth of joint noted.  2+ radial pulse noted.  Normal sensation of bilateral upper extremities.  Neurological: She is alert.  Skin: No rash noted. She is not diaphoretic. No erythema.  Small area of induration about 1 cm wide located at the left elbow.  Drainage noted.  No fluctuance noted.  No overlying erythema or streaking noted.  Psychiatric: She has a normal mood and affect.  Nursing note and vitals reviewed.    ED Treatments / Results  Labs (all labs ordered are listed, but only abnormal results are displayed) Labs Reviewed -  No data to display  EKG None  Radiology No results found.   EMERGENCY DEPARTMENT US SOFT TISSUE INTERPRETATION "Study: Limited Soft Tissue Ultrasound"  INDICATIONS: Soft tissue infection Multiple views of the body part were obtained in real-time with a multi-frequency linear probe  PERFORMED BY: Myself IMAGES ARCHIVED?: No SIDE:Left BODY PART:Upper extremity INTERPRETATION:  Cellulitis present     Procedures Procedures (including critical care time)  Medications Ordered in ED Medications - No data to display   Initial Impression / Assessment and Plan / ED Course  I have reviewed the triage vital signs and the nursing  notes.  Pertinent labs & imaging results that were available during my care of the patient were reviewed by me and considered in my medical decision making (see chart for details).     Patient presents to ED for evaluation of possible abscess to left elbow that has been present for about 5 days.  Area has spontaneously drained with warm compresses.  She is afebrile.  No history of diabetes or immunosuppression.  Ultrasound revealed no abscess or site of drainage at this time.  Area is actively draining.  No fluctuance noted on examination.  No changes to range of motion of joints, erythema or warmth of joint noted that would concern me for septic joint.  Suspect that her symptoms could be due to cellulitis.  Doubt vascular cause.  Will encourage continued compresses, short course of antibiotics and follow-up with PCP for further evaluation.  Advised to return for any severe worsening symptoms.  Portions of this note were generated with [employer]. Dictation errors may occur despite best attempts at proofreading.  Final Clinical Impressions(s) / ED Diagnoses   Final diagnoses:  Cellulitis of left upper extremity    ED Discharge Orders        Ordered    cephALEXin (KEFLEX) 500 MG capsule  4 times daily     12/19/17 1200       Delia Heady, PA-C 12/19/17 1204    Hayden Rasmussen, MD 12/20/17 1751

## 2018-07-23 ENCOUNTER — Emergency Department (HOSPITAL_COMMUNITY)
Admission: EM | Admit: 2018-07-23 | Discharge: 2018-07-23 | Disposition: A | Discharge status: Home / Self Care | Payer: BLUE CROSS/BLUE SHIELD | Attending: Emergency Medicine | Admitting: Emergency Medicine

## 2018-07-23 ENCOUNTER — Other Ambulatory Visit: Payer: Self-pay

## 2018-07-23 ENCOUNTER — Encounter (HOSPITAL_COMMUNITY): Payer: Self-pay | Admitting: *Deleted

## 2018-07-23 DIAGNOSIS — K0889 Other specified disorders of teeth and supporting structures: Secondary | ICD-10-CM | POA: Diagnosis present

## 2018-07-23 DIAGNOSIS — Z79899 Other long term (current) drug therapy: Secondary | ICD-10-CM | POA: Insufficient documentation

## 2018-07-23 DIAGNOSIS — I10 Essential (primary) hypertension: Secondary | ICD-10-CM | POA: Insufficient documentation

## 2018-07-23 MED ORDER — PENICILLIN V POTASSIUM 250 MG PO TABS
500.0000 mg | ORAL_TABLET | Freq: Once | ORAL | Status: AC
  Administered 2018-07-23: 500 mg via ORAL
  Filled 2018-07-23: qty 2

## 2018-07-23 MED ORDER — NAPROXEN 500 MG PO TABS: 500.0000 mg | ORAL_TABLET | Freq: Two times a day (BID) | ORAL | 0 refills | Status: DC

## 2018-07-23 MED ORDER — PENICILLIN V POTASSIUM 500 MG PO TABS: 500.0000 mg | ORAL_TABLET | Freq: Three times a day (TID) | ORAL | 0 refills | Status: DC

## 2018-07-23 MED ORDER — OXYCODONE-ACETAMINOPHEN 5-325 MG PO TABS
1.0000 | ORAL_TABLET | Freq: Once | ORAL | Status: AC
  Administered 2018-07-23: 1 via ORAL
  Filled 2018-07-23: qty 1

## 2018-07-23 NOTE — ED Notes (Signed)
PT states understanding of care given, follow up care, and medication prescribed. PT ambulated from ED to car with a steady gait. 

## 2018-07-23 NOTE — ED Provider Notes (Signed)
[facility] DEPARTMENT Provider Note   CSN: [id number] Arrival date & time: 07/23/18  2134     History   Chief Complaint Chief Complaint  Patient presents with  . Dental Pain    HPI Andrea [name] is a 47 y.o. female.  Patient presents with onset of dental pain that is been waxing and waning for a long time, however worse over the past 3 days.  Patient reports pain in the left maxillary jaw.  She has not had any swelling.  She notes that she has a molar with a large cavity in it.  Pain does not radiate.  No neck pain, trouble breathing, or trouble swallowing.  She does not currently have a dentist.  She tried leftover hydrocodone from weight loss surgery at home without much improvement.  Onset of symptoms acute.  Course is constant.     Past Medical History:  Diagnosis Date  . Anemia   . Hypertension   . Vertigo     There are no active problems to display for this patient.   Past Surgical History:  Procedure Laterality Date  . KNEE SURGERY    . TONSILLECTOMY       OB History   None      Home Medications    Prior to Admission medications   Medication Sig Start Date End Date Taking? Authorizing Provider  cephALEXin (KEFLEX) 500 MG capsule Take 1 capsule (500 mg total) by mouth 4 (four) times daily. 12/19/17   Khatri, Hina, PA-C  naproxen (NAPROSYN) 500 MG tablet Take 1 tablet (500 mg total) by mouth 2 (two) times daily. 07/23/18   Carlisle Cater, PA-C  penicillin v potassium (VEETID) 500 MG tablet Take 1 tablet (500 mg total) by mouth 3 (three) times daily. 07/23/18   Carlisle Cater, PA-C  triamterene-hydrochlorothiazide (DYAZIDE) 37.5-25 MG per capsule Take 1 capsule by mouth daily.    [provider]    Family History No family history on file.  Social History Social History   Tobacco Use  . Smoking status: Never Smoker  . Smokeless tobacco: Never Used  Substance Use Topics  . Alcohol use: No  . Drug use: No      Allergies   Strawberry flavor   Review of Systems Review of Systems  Constitutional: Negative for fever.  HENT: Positive for dental problem. Negative for ear pain, facial swelling, sore throat and trouble swallowing.   Respiratory: Negative for shortness of breath and stridor.   Musculoskeletal: Negative for neck pain.  Skin: Negative for color change.  Neurological: Negative for headaches.     Physical Exam Updated Vital Signs BP (!) 154/102 (BP Location: Right Arm)   Pulse 74   Temp 97.7 F (36.5 C) (Oral)   Resp 18   SpO2 100%   Physical Exam  Constitutional: She appears well-developed and well-nourished.  HENT:  Head: Normocephalic and atraumatic.  Right Ear: Tympanic membrane, external ear and ear canal normal.  Left Ear: Tympanic membrane, external ear and ear canal normal.  Nose: Nose normal.  Mouth/Throat: Uvula is midline, oropharynx is clear and moist and mucous membranes are normal. No trismus in the jaw. Abnormal dentition. Dental caries present. No dental abscesses or uvula swelling. No tonsillar abscesses.  Patient with left maxillary molar pain, ? tooth #14.  Patient has multiple missing teeth.  Tooth with large cavity.  Gums are inflamed.  No gross or palpable abscess.  No obvious facial swelling.  Eyes: Conjunctivae are normal.  Neck: Normal range of motion. Neck supple.  No neck swelling or Ludwig's angina  Lymphadenopathy:    She has no cervical adenopathy.  Neurological: She is alert.  Skin: Skin is warm and dry.  Psychiatric: She has a normal mood and affect.  Nursing note and vitals reviewed.    ED Treatments / Results  Labs (all labs ordered are listed, but only abnormal results are displayed) Labs Reviewed - No data to display  EKG None  Radiology No results found.  Procedures Procedures (including critical care time)  Medications Ordered in ED Medications  penicillin v potassium (VEETID) tablet 500 mg (has no administration  in time range)  oxyCODONE-acetaminophen (PERCOCET/ROXICET) 5-325 MG per tablet 1 tablet (has no administration in time range)     Initial Impression / Assessment and Plan / ED Course  I have reviewed the triage vital signs and the nursing notes.  Pertinent labs & imaging results that were available during my care of the patient were reviewed by me and considered in my medical decision making (see chart for details).     9:49 PM Patient seen and examined. Medications ordered.   Vital signs reviewed and are as follows: BP (!) 154/102 (BP Location: Right Arm)   Pulse 74   Temp 97.7 F (36.5 C) (Oral)   Resp 18   SpO2 100%   Patient counseled to take prescribed medications as directed, return with worsening facial or neck swelling, and to follow-up with their dentist as soon as possible.    Final Clinical Impressions(s) / ED Diagnoses   Final diagnoses:  Tooth pain   Patient with toothache. No fever. Exam unconcerning for Ludwig's angina or other deep tissue infection in neck.    ED Discharge Orders         Ordered    penicillin v potassium (VEETID) 500 MG tablet  3 times daily     07/23/18 2145    naproxen (NAPROSYN) 500 MG tablet  2 times daily     07/23/18 2145           Carlisle Cater, PA-C 07/23/18 2149    Veryl Speak, MD 07/23/18 2256

## 2018-07-23 NOTE — ED Triage Notes (Signed)
Pt c/o L upper toothache worsening today. Has been using peroxide swishes without improvement. Pt has taken hydrocodone liquid for pain without relief.

## 2018-07-23 NOTE — Discharge Instructions (Signed)
Please read and follow all provided instructions.  Your diagnoses today include:  1. Tooth pain     The exam and treatment you received today has been provided on an emergency basis only. This is not a substitute for complete medical or dental care.  Tests performed today include:  Vital signs. See below for your results today.   Medications prescribed:   Naproxen - anti-inflammatory pain medication  Do not exceed 500mg  naproxen every 12 hours, take with food  You have been prescribed an anti-inflammatory medication or NSAID. Take with food. Take smallest effective dose for the shortest duration needed for your pain. Stop taking if you experience stomach pain or vomiting.    Penicillin - antibiotic  You have been prescribed an antibiotic medicine: take the entire course of medicine even if you are feeling better. Stopping early can cause the antibiotic not to work.  Take any prescribed medications only as directed.  Home care instructions:  Follow any educational materials contained in this packet.  Follow-up instructions: Please follow-up with your dentist for further evaluation of your symptoms.   Dental Assistance: See attached dental referrals  Return instructions:   Please return to the Emergency Department if you experience worsening symptoms.  Please return if you develop a fever, you develop more swelling in your face or neck, you have trouble breathing or swallowing food.  Please return if you have any other emergent concerns.  Additional Information:  Your vital signs today were: BP (!) 154/102 (BP Location: Right Arm)    Pulse 74    Temp 97.7 F (36.5 C) (Oral)    Resp 18    SpO2 100%  If your blood pressure (BP) was elevated above 135/85 this visit, please have this repeated by your doctor within one month. --------------

## 2018-08-27 ENCOUNTER — Other Ambulatory Visit (HOSPITAL_BASED_OUTPATIENT_CLINIC_OR_DEPARTMENT_OTHER): Payer: Self-pay | Admitting: Unknown Physician Specialty

## 2018-08-27 DIAGNOSIS — Z1231 Encounter for screening mammogram for malignant neoplasm of breast: Secondary | ICD-10-CM

## 2018-09-01 ENCOUNTER — Ambulatory Visit (HOSPITAL_BASED_OUTPATIENT_CLINIC_OR_DEPARTMENT_OTHER): Payer: Self-pay

## 2018-09-04 ENCOUNTER — Encounter (HOSPITAL_BASED_OUTPATIENT_CLINIC_OR_DEPARTMENT_OTHER): Payer: Self-pay

## 2018-09-04 ENCOUNTER — Ambulatory Visit (HOSPITAL_BASED_OUTPATIENT_CLINIC_OR_DEPARTMENT_OTHER)
Admission: RE | Admit: 2018-09-04 | Discharge: 2018-09-04 | Disposition: A | Discharge status: Home / Self Care | Payer: BLUE CROSS/BLUE SHIELD | Source: Ambulatory Visit | Attending: Unknown Physician Specialty | Admitting: Unknown Physician Specialty

## 2018-09-04 DIAGNOSIS — Z1231 Encounter for screening mammogram for malignant neoplasm of breast: Secondary | ICD-10-CM

## 2019-12-06 ENCOUNTER — Other Ambulatory Visit: Payer: Self-pay

## 2019-12-06 ENCOUNTER — Emergency Department (HOSPITAL_COMMUNITY)
Admission: EM | Admit: 2019-12-06 | Discharge: 2019-12-06 | Disposition: A | Discharge status: Home / Self Care | Payer: BC Managed Care – PPO | Attending: Emergency Medicine | Admitting: Emergency Medicine

## 2019-12-06 ENCOUNTER — Encounter (HOSPITAL_COMMUNITY): Payer: Self-pay | Admitting: *Deleted

## 2019-12-06 DIAGNOSIS — Z8709 Personal history of other diseases of the respiratory system: Secondary | ICD-10-CM | POA: Insufficient documentation

## 2019-12-06 DIAGNOSIS — Z79899 Other long term (current) drug therapy: Secondary | ICD-10-CM | POA: Insufficient documentation

## 2019-12-06 DIAGNOSIS — Z853 Personal history of malignant neoplasm of breast: Secondary | ICD-10-CM | POA: Insufficient documentation

## 2019-12-06 DIAGNOSIS — R5383 Other fatigue: Secondary | ICD-10-CM | POA: Insufficient documentation

## 2019-12-06 DIAGNOSIS — R112 Nausea with vomiting, unspecified: Secondary | ICD-10-CM | POA: Diagnosis not present

## 2019-12-06 DIAGNOSIS — T50901A Poisoning by unspecified drugs, medicaments and biological substances, accidental (unintentional), initial encounter: Secondary | ICD-10-CM | POA: Insufficient documentation

## 2019-12-06 DIAGNOSIS — I10 Essential (primary) hypertension: Secondary | ICD-10-CM | POA: Insufficient documentation

## 2019-12-06 HISTORY — DX: Malignant neoplasm of unspecified site of unspecified female breast: C50.919

## 2019-12-06 LAB — CBC WITH DIFFERENTIAL/PLATELET
Abs Immature Granulocytes: 0.01 10*3/uL (ref 0.00–0.07)
Basophils Absolute: 0 10*3/uL (ref 0.0–0.1)
Basophils Relative: 0 %
Eosinophils Absolute: 0.1 10*3/uL (ref 0.0–0.5)
Eosinophils Relative: 1 %
HCT: 41.7 % (ref 36.0–46.0)
Hemoglobin: 14.4 g/dL (ref 12.0–15.0)
Immature Granulocytes: 0 %
Lymphocytes Relative: 21 %
Lymphs Abs: 1.1 10*3/uL (ref 0.7–4.0)
MCH: 30.8 pg (ref 26.0–34.0)
MCHC: 34.5 g/dL (ref 30.0–36.0)
MCV: 89.3 fL (ref 80.0–100.0)
Monocytes Absolute: 0.3 10*3/uL (ref 0.1–1.0)
Monocytes Relative: 5 %
Neutro Abs: 4 10*3/uL (ref 1.7–7.7)
Neutrophils Relative %: 73 %
Platelets: 201 10*3/uL (ref 150–400)
RBC: 4.67 MIL/uL (ref 3.87–5.11)
RDW: 12.7 % (ref 11.5–15.5)
WBC: 5.5 10*3/uL (ref 4.0–10.5)
nRBC: 0 % (ref 0.0–0.2)

## 2019-12-06 LAB — CBG MONITORING, ED
Glucose-Capillary: 77 mg/dL (ref 70–99)
Glucose-Capillary: 67 mg/dL — ABNORMAL LOW (ref 70–99)
Glucose-Capillary: 121 mg/dL — ABNORMAL HIGH (ref 70–99)

## 2019-12-06 LAB — BASIC METABOLIC PANEL
Anion gap: 12 (ref 5–15)
BUN: 11 mg/dL (ref 6–20)
CO2: 26 mmol/L (ref 22–32)
Calcium: 9.4 mg/dL (ref 8.9–10.3)
Chloride: 102 mmol/L (ref 98–111)
Creatinine, Ser: 0.81 mg/dL (ref 0.44–1.00)
GFR calc Af Amer: >60 mL/min (ref >60)
GFR calc non Af Amer: >60 mL/min (ref >60)
Glucose, Bld: 83 mg/dL (ref 70–99)
Potassium: 3.4 mmol/L — ABNORMAL LOW (ref 3.5–5.1)
Sodium: 140 mmol/L (ref 135–145)

## 2019-12-06 MED ORDER — LACTATED RINGERS IV BOLUS
1000.0000 mL | Freq: Once | INTRAVENOUS | Status: AC
  Administered 2019-12-06: 1000 mL via INTRAVENOUS

## 2019-12-06 NOTE — ED Triage Notes (Signed)
States she accidentally took the entire pen of [place]

## 2019-12-06 NOTE — ED Notes (Signed)
Alert and oriented , respirations unlabored , IV site intact , denies pain , ambulated to toilet to urinate , patient given sandwich , apple sauce and juice to drink .

## 2019-12-06 NOTE — ED Provider Notes (Signed)
[facility] EMERGENCY DEPARTMENT Provider Note   CSN: [id number] Arrival date & time: 12/06/19  1217     History Chief Complaint  Patient presents with  . Drug Overdose    Andrea [name] is a 49 y.o. female.  HPI   49 year old female with a past medical history of hypertension recently started on Saxenda (Liraglutide) by her bariatric doctor presenting to the emergency department complaining of an accidental overdose of her injection pen.  Patient reports she does not like doing the injection so she had a friend helping her.  Instead of dialing in 0.6 mg, the patient dialed 6 mg and the entire 3 mL injection pen was given.  The patient received 18 mg of Saxenda at approximately 1100 yesterday 12/05/2019.  The patient developed nausea, frontal headache, increased sleepiness yesterday associated 1 episode of NBNB emesis today.  Patient denies any other symptoms including fever, cough, congestion, rhinorrhea, chest pain, shortness breath, abdominal pain, change in bowel or bladder function.  Patient has not had anything to eat since receiving the medication.  Patient feels thirsty and that her mouth is dry.  Past Medical History:  Diagnosis Date  . Anemia   . Breast cancer ([place])   . Hypertension   . Vertigo     There are no problems to display for this patient.   Past Surgical History:  Procedure Laterality Date  . BREAST LUMPECTOMY    . KNEE SURGERY    . LAPAROSCOPIC GASTRIC SLEEVE RESECTION    . TONSILLECTOMY       OB History   No obstetric history on file.     No family history on file.  Social History   Tobacco Use  . Smoking status: Never Smoker  . Smokeless tobacco: Never Used  Substance Use Topics  . Alcohol use: No  . Drug use: No    Home Medications Prior to Admission medications   Medication Sig Start Date End Date Taking? Authorizing Provider  cephALEXin (KEFLEX) 500 MG capsule Take 1 capsule (500 mg total) by mouth 4 (four) times  daily. 12/19/17   Khatri, Hina, PA-C  naproxen (NAPROSYN) 500 MG tablet Take 1 tablet (500 mg total) by mouth 2 (two) times daily. 07/23/18   Carlisle Cater, PA-C  penicillin v potassium (VEETID) 500 MG tablet Take 1 tablet (500 mg total) by mouth 3 (three) times daily. 07/23/18   Carlisle Cater, PA-C  triamterene-hydrochlorothiazide (DYAZIDE) 37.5-25 MG per capsule Take 1 capsule by mouth daily.    [provider]    Allergies    Strawberry flavor  Review of Systems   Review of Systems  Constitutional: Positive for fatigue. Negative for activity change, appetite change, chills, diaphoresis and fever.  HENT: Negative for congestion and rhinorrhea.   Respiratory: Negative for cough, shortness of breath and wheezing.   Cardiovascular: Negative for chest pain.  Gastrointestinal: Positive for nausea and vomiting. Negative for abdominal distention, abdominal pain and diarrhea.  Genitourinary: Negative for decreased urine volume, difficulty urinating, dysuria, frequency and urgency.  Musculoskeletal: Negative for gait problem.  Skin: Negative for color change and wound.  Neurological: Positive for headaches. Negative for dizziness, syncope, weakness and light-headedness.  All other systems reviewed and are negative.   Physical Exam Updated Vital Signs BP (!) 172/87 (BP Location: Right Arm)   Pulse 97   Temp 98.1 F (36.7 C) (Oral)   Resp 16   Ht 5' (1.524 m)   Wt 84.8 kg   SpO2 99%  BMI 36.52 kg/m   Physical Exam Vitals and nursing note reviewed.  Constitutional:      General: She is not in acute distress.    Appearance: Normal appearance. She is normal weight. She is not ill-appearing.  HENT:     Head: Normocephalic.     Right Ear: External ear normal.     Left Ear: External ear normal.     Nose: Nose normal.     Mouth/Throat:     Mouth: Mucous membranes are dry.     Pharynx: Oropharynx is clear.  Eyes:     Extraocular Movements: Extraocular movements intact.    Cardiovascular:     Rate and Rhythm: Normal rate and regular rhythm.     Pulses: Normal pulses.     Heart sounds: Normal heart sounds.  Pulmonary:     Effort: Pulmonary effort is normal. No respiratory distress.     Breath sounds: Normal breath sounds. No wheezing or rhonchi.  Abdominal:     General: Bowel sounds are normal.     Palpations: Abdomen is soft.     Tenderness: There is no abdominal tenderness. There is no guarding.  Musculoskeletal:     Cervical back: Normal range of motion.     Right lower leg: No edema.     Left lower leg: No edema.  Skin:    General: Skin is warm and dry.     Capillary Refill: Capillary refill takes less than 2 seconds.  Neurological:     General: No focal deficit present.     Mental Status: She is alert and oriented to person, place, and time. Mental status is at baseline.  Psychiatric:        Mood and Affect: Mood normal.     ED Results / Procedures / Treatments   Labs (all labs ordered are listed, but only abnormal results are displayed) Labs Reviewed  BASIC METABOLIC PANEL - Abnormal; Notable for the following components:      Result Value   Potassium 3.4 (*)    All other components within normal limits  CBG MONITORING, ED - Abnormal; Notable for the following components:   Glucose-Capillary 67 (*)    All other components within normal limits  CBG MONITORING, ED - Abnormal; Notable for the following components:   Glucose-Capillary 121 (*)    All other components within normal limits  CBC WITH DIFFERENTIAL/PLATELET  CBG MONITORING, ED    EKG EKG Interpretation  Date/Time:  Monday December 06 2019 14:38:19 EDT Ventricular Rate:  86 PR Interval:  164 QRS Duration: 88 QT Interval:  374 QTC Calculation: 447 R Axis:   3 Text Interpretation: Normal sinus rhythm with sinus arrhythmia Confirmed by Lajean Saver [phone]) on 12/06/2019 5:44:58 PM   Radiology No results found.  Procedures Procedures (including critical care  time)  Medications Ordered in ED Medications  lactated ringers bolus 1,000 mL (0 mLs Intravenous Stopped 12/06/19 1947)    ED Course  I have reviewed the triage vital signs and the nursing notes.  Pertinent labs & imaging results that were available during my care of the patient were reviewed by me and considered in my medical decision making (see chart for details).    MDM Rules/Calculators/A&P                      49 year old female with a past medical history of hypertension recently started on Saxenda (Liraglutide) by her bariatric doctor presenting to the emergency department complaining of an  accidental overdose of her injection pen.  Spoke with [facility] [place]) - who recommended observation for 12 hours, however the patient has been s/p overdose for >24 hours. Because of this poison control recommended supportive care. Risk for hypoglycemia is minimal this far out from the injection.   Initial interventions 1 L IV LR  Unremarkable ECG.  No significant leukocytosis, anemia, platelets 201, minimal hypokalemia to 3.4 otherwise no significant regiments on BMP  Upon reassessment, patient remains asymptomatic.  Initial serum blood glucose without hypoglycemia, repeat point-of-care glucose also without hypoglycemia.  The patient is for the 24-hours from her overdose, the patient is unlikely to develop hypoglycemia.  Given the above findings, my suspicion is that patient is safe and stable for discharge plan for follow-up with her regular doctor in regard to represcribing her medication.  Patient is tolerating p.o. in the emergency department with improvement in her point-of-care glucose.  Encouraged the patient to follow-up with her regular doctor for consideration of antihypertensives as well.  The patient is safe and stable for discharge at this time with return precautions provided and a plan for follow up care in place as needed  The plan for this patient was discussed  with Dr. Rex Kras, who voiced agreement and who oversaw evaluation and treatment of this patient.  Final Clinical Impression(s) / ED Diagnoses Final diagnoses:  Accidental overdose, initial encounter    Rx / DC Orders ED Discharge Orders    None       Filbert Berthold, MD 12/06/19 2218    Lajean Saver, MD 12/06/19 2222

## 2020-02-19 ENCOUNTER — Emergency Department (HOSPITAL_COMMUNITY): Payer: BC Managed Care – PPO

## 2020-02-19 ENCOUNTER — Inpatient Hospital Stay (HOSPITAL_COMMUNITY)
Admission: EM | Admit: 2020-02-19 | Discharge: 2020-02-23 | DRG: 419 | Disposition: A | Discharge status: Home / Self Care | Payer: BC Managed Care – PPO | Attending: Internal Medicine | Admitting: Internal Medicine

## 2020-02-19 ENCOUNTER — Other Ambulatory Visit: Payer: Self-pay

## 2020-02-19 ENCOUNTER — Encounter (HOSPITAL_COMMUNITY): Payer: Self-pay | Admitting: Emergency Medicine

## 2020-02-19 ENCOUNTER — Inpatient Hospital Stay (HOSPITAL_COMMUNITY): Payer: BC Managed Care – PPO

## 2020-02-19 DIAGNOSIS — Z9884 Bariatric surgery status: Secondary | ICD-10-CM | POA: Diagnosis not present

## 2020-02-19 DIAGNOSIS — R1013 Epigastric pain: Secondary | ICD-10-CM | POA: Diagnosis not present

## 2020-02-19 DIAGNOSIS — Z79899 Other long term (current) drug therapy: Secondary | ICD-10-CM

## 2020-02-19 DIAGNOSIS — Z923 Personal history of irradiation: Secondary | ICD-10-CM

## 2020-02-19 DIAGNOSIS — K8020 Calculus of gallbladder without cholecystitis without obstruction: Secondary | ICD-10-CM | POA: Diagnosis not present

## 2020-02-19 DIAGNOSIS — K8000 Calculus of gallbladder with acute cholecystitis without obstruction: Principal | ICD-10-CM | POA: Diagnosis present

## 2020-02-19 DIAGNOSIS — Z20822 Contact with and (suspected) exposure to covid-19: Secondary | ICD-10-CM | POA: Diagnosis present

## 2020-02-19 DIAGNOSIS — R109 Unspecified abdominal pain: Secondary | ICD-10-CM

## 2020-02-19 DIAGNOSIS — Z853 Personal history of malignant neoplasm of breast: Secondary | ICD-10-CM

## 2020-02-19 DIAGNOSIS — E876 Hypokalemia: Secondary | ICD-10-CM | POA: Diagnosis present

## 2020-02-19 DIAGNOSIS — I10 Essential (primary) hypertension: Secondary | ICD-10-CM | POA: Diagnosis not present

## 2020-02-19 DIAGNOSIS — D649 Anemia, unspecified: Secondary | ICD-10-CM | POA: Diagnosis present

## 2020-02-19 DIAGNOSIS — Z9221 Personal history of antineoplastic chemotherapy: Secondary | ICD-10-CM | POA: Diagnosis not present

## 2020-02-19 LAB — URINALYSIS, ROUTINE W REFLEX MICROSCOPIC
Bilirubin Urine: NEGATIVE
Glucose, UA: NEGATIVE mg/dL
Hgb urine dipstick: NEGATIVE
Ketones, ur: NEGATIVE mg/dL
Leukocytes,Ua: NEGATIVE
Nitrite: NEGATIVE
Protein, ur: NEGATIVE mg/dL
Specific Gravity, Urine: 1.013 (ref 1.005–1.030)
pH: 7 (ref 5.0–8.0)

## 2020-02-19 LAB — CBC
HCT: 39.1 % (ref 36.0–46.0)
Hemoglobin: 13.4 g/dL (ref 12.0–15.0)
MCH: 31.1 pg (ref 26.0–34.0)
MCHC: 34.3 g/dL (ref 30.0–36.0)
MCV: 90.7 fL (ref 80.0–100.0)
Platelets: 170 10*3/uL (ref 150–400)
RBC: 4.31 MIL/uL (ref 3.87–5.11)
RDW: 12.2 % (ref 11.5–15.5)
WBC: 4.4 10*3/uL (ref 4.0–10.5)
nRBC: 0 % (ref 0.0–0.2)

## 2020-02-19 LAB — COMPREHENSIVE METABOLIC PANEL
ALT: 18 U/L (ref 0–44)
AST: 39 U/L (ref 15–41)
Albumin: 4.1 g/dL (ref 3.5–5.0)
Alkaline Phosphatase: 96 U/L (ref 38–126)
Anion gap: 10 (ref 5–15)
BUN: 13 mg/dL (ref 6–20)
CO2: 25 mmol/L (ref 22–32)
Calcium: 8.9 mg/dL (ref 8.9–10.3)
Chloride: 106 mmol/L (ref 98–111)
Creatinine, Ser: 0.87 mg/dL (ref 0.44–1.00)
GFR calc Af Amer: >60 mL/min (ref >60)
GFR calc non Af Amer: >60 mL/min (ref >60)
Glucose, Bld: 102 mg/dL — ABNORMAL HIGH (ref 70–99)
Potassium: 3.2 mmol/L — ABNORMAL LOW (ref 3.5–5.1)
Sodium: 141 mmol/L (ref 135–145)
Total Bilirubin: 1.6 mg/dL — ABNORMAL HIGH (ref 0.3–1.2)
Total Protein: 6.8 g/dL (ref 6.5–8.1)

## 2020-02-19 LAB — I-STAT BETA HCG BLOOD, ED (MC, WL, AP ONLY): I-stat hCG, quantitative: <5 m[IU]/mL (ref <5)

## 2020-02-19 LAB — LIPASE, BLOOD: Lipase: 36 U/L (ref 11–51)

## 2020-02-19 LAB — SARS CORONAVIRUS 2 BY RT PCR (HOSPITAL ORDER, PERFORMED IN ~~LOC~~ HOSPITAL LAB): SARS Coronavirus 2: NEGATIVE

## 2020-02-19 MED ORDER — HYDRALAZINE HCL 20 MG/ML IJ SOLN
10.0000 mg | INTRAMUSCULAR | Status: DC | PRN
  Administered 2020-02-19: 10 mg via INTRAVENOUS
  Filled 2020-02-19: qty 1

## 2020-02-19 MED ORDER — POTASSIUM CHLORIDE CRYS ER 20 MEQ PO TBCR: 40.0000 meq | EXTENDED_RELEASE_TABLET | ORAL | Status: DC

## 2020-02-19 MED ORDER — POTASSIUM CHLORIDE 10 MEQ/100ML IV SOLN
10.0000 meq | INTRAVENOUS | Status: AC
  Administered 2020-02-19 (×3): 10 meq via INTRAVENOUS
  Filled 2020-02-19 (×3): qty 100

## 2020-02-19 MED ORDER — SODIUM CHLORIDE 0.9% FLUSH
3.0000 mL | Freq: Once | INTRAVENOUS | Status: AC
  Administered 2020-02-20: 11:00:00 3 mL via INTRAVENOUS

## 2020-02-19 MED ORDER — ONDANSETRON HCL 4 MG/2ML IJ SOLN
4.0000 mg | Freq: Once | INTRAMUSCULAR | Status: AC
  Administered 2020-02-19: 4 mg via INTRAVENOUS
  Filled 2020-02-19: qty 2

## 2020-02-19 MED ORDER — ACETAMINOPHEN 325 MG PO TABS
650.0000 mg | ORAL_TABLET | Freq: Four times a day (QID) | ORAL | Status: DC | PRN
  Administered 2020-02-19 – 2020-02-21 (×3): 650 mg via ORAL
  Filled 2020-02-19 (×3): qty 2

## 2020-02-19 MED ORDER — ENOXAPARIN SODIUM 40 MG/0.4ML ~~LOC~~ SOLN
40.0000 mg | SUBCUTANEOUS | Status: DC
  Administered 2020-02-19 – 2020-02-20 (×2): 40 mg via SUBCUTANEOUS
  Filled 2020-02-19 (×2): qty 0.4

## 2020-02-19 MED ORDER — IOHEXOL 300 MG/ML  SOLN
100.0000 mL | Freq: Once | INTRAMUSCULAR | Status: AC | PRN
  Administered 2020-02-19: 100 mL via INTRAVENOUS

## 2020-02-19 MED ORDER — ALBUTEROL SULFATE (2.5 MG/3ML) 0.083% IN NEBU: 2.5000 mg | INHALATION_SOLUTION | Freq: Four times a day (QID) | RESPIRATORY_TRACT | Status: DC | PRN

## 2020-02-19 MED ORDER — SODIUM CHLORIDE 0.9 % IV SOLN: INTRAVENOUS | Status: DC

## 2020-02-19 MED ORDER — FENTANYL CITRATE (PF) 100 MCG/2ML IJ SOLN
50.0000 ug | Freq: Once | INTRAMUSCULAR | Status: AC
  Administered 2020-02-19: 50 ug via INTRAVENOUS
  Filled 2020-02-19: qty 2

## 2020-02-19 MED ORDER — SODIUM CHLORIDE 0.9% FLUSH
3.0000 mL | Freq: Two times a day (BID) | INTRAVENOUS | Status: DC
  Administered 2020-02-20 – 2020-02-23 (×6): 3 mL via INTRAVENOUS

## 2020-02-19 MED ORDER — ONDANSETRON HCL 4 MG/2ML IJ SOLN
4.0000 mg | Freq: Four times a day (QID) | INTRAMUSCULAR | Status: DC | PRN
  Administered 2020-02-19 – 2020-02-22 (×3): 4 mg via INTRAVENOUS
  Filled 2020-02-19 (×3): qty 2

## 2020-02-19 MED ORDER — ACETAMINOPHEN 650 MG RE SUPP: 650.0000 mg | Freq: Four times a day (QID) | RECTAL | Status: DC | PRN

## 2020-02-19 MED ORDER — ONDANSETRON HCL 4 MG PO TABS: 4.0000 mg | ORAL_TABLET | Freq: Four times a day (QID) | ORAL | Status: DC | PRN

## 2020-02-19 MED ORDER — FENTANYL CITRATE (PF) 100 MCG/2ML IJ SOLN
25.0000 ug | INTRAMUSCULAR | Status: DC | PRN
  Administered 2020-02-19: 25 ug via INTRAVENOUS
  Filled 2020-02-19 (×2): qty 2

## 2020-02-19 NOTE — Consult Note (Signed)
Reason for Consult:ab pain Referring Physician: Dr Matt Holmes Andrea [name] is an 49 y.o. female.  HPI: 15 yof with history lap gastric sleeve presents with upper abdominal pain since this morning. Has nausea, no emesis.  Nothing making it better at home so came to er.  Eating makes it worse.  No fevers. Does have prior more mild episodes. Has lost fair amt weight her family says from sleeve. Ct without any concern for sleeve, has cholelithiasis with no evidence cholecystitis and 9 mm cbd as well. [place] has cholelithiasis with no gb wall thickening of fluid with mildly dilated cbd. Her lfts show a tb of 1.6.  I was asked to see her.  Other surgeries done at [facility]  Past Medical History:  Diagnosis Date  . Anemia   . Breast cancer ([place])   . Hypertension   . Vertigo     Past Surgical History:  Procedure Laterality Date  . BREAST LUMPECTOMY    . KNEE SURGERY    . LAPAROSCOPIC GASTRIC SLEEVE RESECTION    . TONSILLECTOMY      No family history on file.  Social History:  reports that she has never smoked. She has never used smokeless tobacco. She reports that she does not drink alcohol and does not use drugs.  Allergies:  Allergies  Allergen Reactions  . Strawberry Flavor Anaphylaxis    Medications: I have reviewed the patient's current medications.  Results for orders placed or performed during the hospital encounter of 02/19/20 (from the past 48 hour(s))  Lipase, blood     Status: None   Collection Time: 02/19/20 10:05 AM  Result Value Ref Range   Lipase 36 11 - 51 U/L    Comment: Performed at [facility], [address], [place], [place] [address]  Comprehensive metabolic panel     Status: Abnormal   Collection Time: 02/19/20 10:05 AM  Result Value Ref Range   Sodium 141 135 - 145 mmol/L   Potassium 3.2 (L) 3.5 - 5.1 mmol/L   Chloride 106 98 - 111 mmol/L   CO2 25 22 - 32 mmol/L   Glucose, Bld 102 (H) 70 - 99 mg/dL    Comment: Glucose reference range applies only to  samples taken after fasting for at least 8 hours.   BUN 13 6 - 20 mg/dL   Creatinine, Ser 0.87 0.44 - 1.00 mg/dL   Calcium 8.9 8.9 - 10.3 mg/dL   Total Protein 6.8 6.5 - 8.1 g/dL   Albumin 4.1 3.5 - 5.0 g/dL   AST 39 15 - 41 U/L   ALT 18 0 - 44 U/L   Alkaline Phosphatase 96 38 - 126 U/L   Total Bilirubin 1.6 (H) 0.3 - 1.2 mg/dL   GFR calc non Af Amer >60 >60 mL/min   GFR calc Af Amer >60 >60 mL/min   Anion gap 10 5 - 15    Comment: Performed at [facility], [place] [address] [place] [address]  CBC     Status: None   Collection Time: 02/19/20 10:05 AM  Result Value Ref Range   WBC 4.4 4.0 - 10.5 K/uL   RBC 4.31 3.87 - 5.11 MIL/uL   Hemoglobin 13.4 12.0 - 15.0 g/dL   HCT 39.1 36 - 46 %   MCV 90.7 80.0 - 100.0 fL   MCH 31.1 26.0 - 34.0 pg   MCHC 34.3 30.0 - 36.0 g/dL   RDW 12.2 11.5 - 15.5 %  Platelets 170 150 - 400 K/uL   nRBC 0.0 0.0 - 0.2 %    Comment: Performed at [facility], [place] [address] [place], [place] [address]  I-Stat beta hCG blood, ED     Status: None   Collection Time: 02/19/20 10:12 AM  Result Value Ref Range   I-stat hCG, quantitative <5.0 <5 mIU/mL   Comment 3            Comment:   GEST. AGE      CONC.  (mIU/mL)   <=1 WEEK        5 - 50     2 WEEKS       50 - 500     3 WEEKS       100 - 10,000     4 WEEKS     1,000 - 30,000        FEMALE AND NON-PREGNANT FEMALE:     LESS THAN 5 mIU/mL   Urinalysis, Routine w reflex microscopic     Status: None   Collection Time: 02/19/20 10:15 AM  Result Value Ref Range   Color, Urine YELLOW YELLOW   APPearance CLEAR CLEAR   Specific Gravity, Urine 1.013 1.005 - 1.030   pH 7.0 5.0 - 8.0   Glucose, UA NEGATIVE NEGATIVE mg/dL   Hgb urine dipstick NEGATIVE NEGATIVE   Bilirubin Urine NEGATIVE NEGATIVE   Ketones, ur NEGATIVE NEGATIVE mg/dL   Protein, ur NEGATIVE NEGATIVE mg/dL   Nitrite NEGATIVE NEGATIVE   Leukocytes,Ua NEGATIVE NEGATIVE    Comment: Performed at [facility], [place]  [address] [place], [place] [address]  SARS Coronavirus 2 by RT PCR (hospital order, performed in [facility] hospital lab) Nasopharyngeal Nasopharyngeal Swab     Status: None   Collection Time: 02/19/20  3:16 PM   Specimen: Nasopharyngeal Swab  Result Value Ref Range   SARS Coronavirus 2 NEGATIVE NEGATIVE    Comment: (NOTE) SARS-CoV-2 target nucleic acids are NOT DETECTED.  The SARS-CoV-2 RNA is generally detectable in upper and lower respiratory specimens during the acute phase of infection. The lowest concentration of SARS-CoV-2 viral copies this assay can detect is 250 copies / mL. A negative result does not preclude SARS-CoV-2 infection and should not be used as the sole basis for treatment or other patient management decisions.  A negative result may occur with improper specimen collection / handling, submission of specimen other than nasopharyngeal swab, presence of viral mutation(s) within the areas targeted by this assay, and inadequate number of viral copies (<250 copies / mL). A negative result must be combined with clinical observations, patient history, and epidemiological information.  Fact Sheet for Patients:   StrictlyIdeas.no  Fact Sheet for Healthcare Providers: BankingDealers.co.za  This test is not yet approved or  cleared by the [place] FDA and has been authorized for detection and/or diagnosis of SARS-CoV-2 by FDA under an Emergency Use Authorization (EUA).  This EUA will remain in effect (meaning this test can be used) for the duration of the COVID-19 declaration under Section 564(b)(1) of the Act, 21 U.S.C. section 360bbb-3(b)(1), unless the authorization is terminated or revoked sooner.  Performed at [facility], [place] [address], [place], [place] [address]     US Abdomen Complete  Result Date: 02/19/2020 CLINICAL DATA:  Abdominal and epigastric pain since 0845 hours today, history hypertension and  breast cancer EXAM: ABDOMEN ULTRASOUND COMPLETE COMPARISON:  None FINDINGS: Gallbladder: Multiple tiny shadowing calculi within gallbladder, largest measurable  calculus 5 mm diameter. Gallbladder wall upper normal thickness. No sonographic Murphy sign or pericholecystic fluid. Common bile duct: Diameter: 8 mm, minimally prominent. Liver: Normal echogenicity without focal mass. Minimal central intrahepatic biliary dilatation. Portal vein is patent on color Doppler imaging with normal direction of blood flow towards the liver. IVC: Normal appearance Pancreas: Portions of tail obscured by bowel gas. Visualized portions normal appearance. Spleen: Normal appearance, 6.9 cm length Right Kidney: Length: 9.8 cm. Normal morphology without mass or hydronephrosis. Left Kidney: Length: 9.8 cm. Normal morphology without mass or hydronephrosis. Abdominal aorta: Normal caliber proximally, mid to distal portions obscured by bowel gas Other findings: No free fluid IMPRESSION: Cholelithiasis with upper normal gallbladder wall thickness. Mildly prominent CBD with minimal central intrahepatic biliary dilatation, recommend correlation with LFTs. Electronically Signed   By: Lavonia Dana M.D.   On: 02/19/2020 13:16   CT ABDOMEN PELVIS W CONTRAST  Result Date: 02/19/2020 CLINICAL DATA:  Cholecystitis. EXAM: CT ABDOMEN AND PELVIS WITH CONTRAST TECHNIQUE: Multidetector CT imaging of the abdomen and pelvis was performed using the standard protocol following bolus administration of intravenous contrast. CONTRAST:  139mL OMNIPAQUE IOHEXOL 300 MG/ML  SOLN COMPARISON:  None recent. FINDINGS: Lower chest: The lung bases are clear. The heart is enlarged. Hepatobiliary: The patient's prior CT and MRI were not available for comparison. There is a 2.2 cm hypoattenuating lesion in hepatic segment 6 (axial series 3, image 24). This presumably represents the previously documented hepatic hemangioma. Cholelithiasis without acute inflammation.The common  bile duct is dilated measuring approximately 9 mm distally. Pancreas: Normal contours without ductal dilatation. No peripancreatic fluid collection. Spleen: The spleen is borderline enlarged measuring approximately 12 cm. Adrenals/Urinary Tract: --Adrenal glands: Unremarkable. --Right kidney/ureter: No hydronephrosis or radiopaque kidney stones. --Left kidney/ureter: No hydronephrosis or radiopaque kidney stones. --Urinary bladder: The bladder is underdistended. There does appear to be some diffuse bladder wall thickening. Stomach/Bowel: --Stomach/Duodenum: There are postsurgical changes of the stomach. --Small bowel: Unremarkable. --Colon: Unremarkable. --Appendix: Normal. Vascular/Lymphatic: Atherosclerotic calcification is present within the non-aneurysmal abdominal aorta, without hemodynamically significant stenosis. --No retroperitoneal lymphadenopathy. --No mesenteric lymphadenopathy. --No pelvic or inguinal lymphadenopathy. Reproductive: There is apparent diffuse thickening of the endometrium. Other: No ascites or free air. There is a fat containing umbilical hernia. Musculoskeletal. No acute displaced fractures. IMPRESSION: 1. Cholelithiasis without acute inflammation. 2. Dilated common bile duct measuring up to 9 mm distally. Correlation with laboratory studies is recommended. If there is concern for choledocholithiasis, follow-up with MRCP or ERCP is recommended. 3. Apparent diffuse thickening of the endometrium. Recommend further evaluation with nonemergent outpatient pelvic ultrasound. 4. Bladder wall thickening. Correlation with urinalysis is recommended. 5. Cardiomegaly. Aortic Atherosclerosis (ICD10-I70.0). Electronically Signed   By: Constance Holster M.D.   On: 02/19/2020 17:09   DG Abd 2 Views  Result Date: 02/19/2020 CLINICAL DATA:  Upper abdominal pain since 8:45 a.m. EXAM: ABDOMEN - 2 VIEW COMPARISON:  None. FINDINGS: Normal bowel gas pattern without free peritoneal air. Bilateral pelvic  phleboliths. Mild scoliosis. Lumbar and lower thoracic spine degenerative changes. IMPRESSION: No acute abnormality. Electronically Signed   By: Claudie Revering M.D.   On: 02/19/2020 12:13    Review of Systems  Gastrointestinal: Positive for abdominal pain and nausea.  Musculoskeletal: Negative for back pain.  All other systems reviewed and are negative.  Blood pressure (!) 189/114, pulse 82, temperature 98.2 F (36.8 C), temperature source Oral, resp. rate 18, height 5\' 1"  (1.549 m), weight 87.5 kg, last menstrual period 11/24/2017, SpO2 100 %. Physical  Exam Constitutional:      Appearance: She is well-developed.  Eyes:     General: No scleral icterus. Cardiovascular:     Rate and Rhythm: Normal rate and regular rhythm.  Pulmonary:     Effort: Pulmonary effort is normal.     Breath sounds: Normal breath sounds.  Abdominal:     General: Abdomen is flat. A surgical scar is present.     Palpations: Abdomen is soft.     Tenderness: There is abdominal tenderness in the right upper quadrant, epigastric area and left upper quadrant.     Hernia: No hernia is present.  Skin:    General: Skin is warm and dry.     Capillary Refill: Capillary refill takes less than 2 seconds.  Neurological:     General: No focal deficit present.     Mental Status: She is alert.  Psychiatric:        Mood and Affect: Mood normal.        Behavior: Behavior normal.     Assessment/Plan: Cholelithiasis symptomatic Mild elevated TB -admitted, would give abx, npo after mn -recheck lfts likely just needs to go to OR in am for lap chole  Rolm Bookbinder 02/19/2020, 7:02 PM

## 2020-02-19 NOTE — Progress Notes (Signed)
Transferred from ED via stretcher. Alert,oriented x4.Will continue to monitor

## 2020-02-19 NOTE — ED Triage Notes (Signed)
C/o upper abd pain since 8:45am with nausea.  Denies vomiting and diarrhea.

## 2020-02-19 NOTE — H&P (Signed)
History and Physical    Andrea [name] PYK:[id number] DOB: 06/23/1971 DOA: 02/19/2020  Referring MD/NP/PA: Davonna Belling, MD PCP: Patient, No Pcp Per  Patient coming from: home   Chief Complaint: Upper abdominal pain  I have personally briefly reviewed patient's old medical records in [place]   HPI: Andrea [name] is a 49 y.o. female with medical history significant of hypertension, anemia, s/p gastric sleeve, breast cancer s/p lumpectomy with chemo and radiation completed in March, and obesity who presented with upper abdominal pain starting this morning around 8:45 AM.  Patient had not eaten anything since last night.  Reported having nausea with nonbloody emesis.  Denies having any recent fever, chest pain, shortness of breath, dysuria, or diarrhea.  She reported that it felt like if she had a bowel movement it would relieve symptoms.  Patient reports chronically having chills secondary to anemia.     ED Course: Upon admission into the emergency department patient was seen to be afebrile with blood pressures elevated up to 179/118, and all other vital signs maintained.  Labs significant for potassium 3.2 and total bilirubin 1.6.  Abdominal x-ray showed no acute abnormality.  Ultrasound of the abdomen revealed cholelithiasis with gallbladder thickening at the upper limit of normal with mildly prominent common bile duct and intrahepatic ducts.  Patient was given Zofran and fentanyl and TRH called to admit.  Review of Systems  Constitutional: Positive for chills (Chronic). Negative for fever.  HENT: Negative for congestion and nosebleeds.   Eyes: Negative for photophobia and pain.  Respiratory: Negative for cough and shortness of breath.   Cardiovascular: Negative for chest pain and leg swelling.  Gastrointestinal: Positive for abdominal pain, nausea and vomiting.  Genitourinary: Negative for dysuria and hematuria.  Musculoskeletal: Negative for falls and myalgias.  Skin:  Negative for itching.  Neurological: Negative for focal weakness and loss of consciousness.  Psychiatric/Behavioral: Negative for substance abuse. The patient does not have insomnia.     Past Medical History:  Diagnosis Date  . Anemia   . Breast cancer ([place])   . Hypertension   . Vertigo     Past Surgical History:  Procedure Laterality Date  . BREAST LUMPECTOMY    . KNEE SURGERY    . LAPAROSCOPIC GASTRIC SLEEVE RESECTION    . TONSILLECTOMY       reports that she has never smoked. She has never used smokeless tobacco. She reports that she does not drink alcohol and does not use drugs.  Allergies  Allergen Reactions  . Strawberry Flavor Anaphylaxis    No family history on file.  Prior to Admission medications   Medication Sig Start Date End Date Taking? Authorizing Provider  ferrous sulfate 325 (65 FE) MG tablet Take 1 tablet by mouth every Monday, Wednesday, and Friday.    Yes [provider]  liraglutide (VICTOZA) 18 MG/3ML SOPN Inject 1.2 mg into the skin daily. 11/26/19  Yes [provider]  Multiple Vitamin (MULTIVITAMIN WITH MINERALS) TABS tablet Take 1 tablet by mouth daily.   Yes [provider]  triamterene-hydrochlorothiazide (DYAZIDE) 37.5-25 MG per capsule Take 1 capsule by mouth daily.   Yes [provider]    Physical Exam:  Constitutional: Middle-aged female who appears to be in some discomfort Vitals:   02/19/20 1002 02/19/20 1003 02/19/20 1341  BP: (!) 179/118  (!) 175/105  Pulse: 90  72  Resp: 18  18  Temp: 98.2 F (36.8 C)    TempSrc: Oral    SpO2:  100%  99%  Weight:  87.5 kg   Height:  5\' 1"  (1.549 m)    Eyes: PERRL, lids and conjunctivae normal ENMT: Mucous membranes are moist. Posterior pharynx clear of any exudate or lesions.   Neck: normal, supple, no masses, no thyromegaly Respiratory: clear to auscultation bilaterally, no wheezing, no crackles. Normal respiratory effort. No accessory muscle use.    Cardiovascular: Regular rate and rhythm, no murmurs / rubs / gallops. No extremity edema. 2+ pedal pulses. No carotid bruits.  Abdomen: Epigastric tenderness appreciated.  Bowel sounds present. Musculoskeletal: no clubbing / cyanosis. No joint deformity upper and lower extremities. Good ROM, no contractures. Normal muscle tone.  Skin: no rashes, lesions, ulcers. No induration Neurologic: CN 2-12 grossly intact. Sensation intact, DTR normal. Strength 5/5 in all 4.  Psychiatric: Normal judgment and insight. Alert and oriented x 3. Normal mood.     Labs on Admission: I have personally reviewed following labs and imaging studies  CBC: Recent Labs  Lab 02/19/20 1005  WBC 4.4  HGB 13.4  HCT 39.1  MCV 90.7  PLT 644   Basic Metabolic Panel: Recent Labs  Lab 02/19/20 1005  NA 141  K 3.2*  CL 106  CO2 25  GLUCOSE 102*  BUN 13  CREATININE 0.87  CALCIUM 8.9   GFR: Estimated Creatinine Clearance: 77.8 mL/min (by C-G formula based on SCr of 0.87 mg/dL). Liver Function Tests: Recent Labs  Lab 02/19/20 1005  AST 39  ALT 18  ALKPHOS 96  BILITOT 1.6*  PROT 6.8  ALBUMIN 4.1   Recent Labs  Lab 02/19/20 1005  LIPASE 36   No results for input(s): AMMONIA in the last 168 hours. Coagulation Profile: No results for input(s): INR, PROTIME in the last 168 hours. Cardiac Enzymes: No results for input(s): CKTOTAL, CKMB, CKMBINDEX, TROPONINI in the last 168 hours. BNP (last 3 results) No results for input(s): PROBNP in the last 8760 hours. HbA1C: No results for input(s): HGBA1C in the last 72 hours. CBG: No results for input(s): GLUCAP in the last 168 hours. Lipid Profile: No results for input(s): CHOL, HDL, LDLCALC, TRIG, CHOLHDL, LDLDIRECT in the last 72 hours. Thyroid Function Tests: No results for input(s): TSH, T4TOTAL, FREET4, T3FREE, THYROIDAB in the last 72 hours. Anemia Panel: No results for input(s): VITAMINB12, FOLATE, FERRITIN, TIBC, IRON, RETICCTPCT in the last 72  hours. Urine analysis:    Component Value Date/Time   COLORURINE YELLOW 02/19/2020 1015   APPEARANCEUR CLEAR 02/19/2020 1015   LABSPEC 1.013 02/19/2020 1015   PHURINE 7.0 02/19/2020 1015   GLUCOSEU NEGATIVE 02/19/2020 1015   HGBUR NEGATIVE 02/19/2020 1015   [place] NEGATIVE 02/19/2020 1015   KETONESUR NEGATIVE 02/19/2020 1015   PROTEINUR NEGATIVE 02/19/2020 1015   NITRITE NEGATIVE 02/19/2020 1015   LEUKOCYTESUR NEGATIVE 02/19/2020 1015   Sepsis Labs: No results found for this or any previous visit (from the past 240 hour(s)).   Radiological Exams on Admission: US Abdomen Complete  Result Date: 02/19/2020 CLINICAL DATA:  Abdominal and epigastric pain since 0845 hours today, history hypertension and breast cancer EXAM: ABDOMEN ULTRASOUND COMPLETE COMPARISON:  None FINDINGS: Gallbladder: Multiple tiny shadowing calculi within gallbladder, largest measurable calculus 5 mm diameter. Gallbladder wall upper normal thickness. No sonographic Murphy sign or pericholecystic fluid. Common bile duct: Diameter: 8 mm, minimally prominent. Liver: Normal echogenicity without focal mass. Minimal central intrahepatic biliary dilatation. Portal vein is patent on color Doppler imaging with normal direction of blood flow towards the liver. IVC: Normal appearance Pancreas:  Portions of tail obscured by bowel gas. Visualized portions normal appearance. Spleen: Normal appearance, 6.9 cm length Right Kidney: Length: 9.8 cm. Normal morphology without mass or hydronephrosis. Left Kidney: Length: 9.8 cm. Normal morphology without mass or hydronephrosis. Abdominal aorta: Normal caliber proximally, mid to distal portions obscured by bowel gas Other findings: No free fluid IMPRESSION: Cholelithiasis with upper normal gallbladder wall thickness. Mildly prominent CBD with minimal central intrahepatic biliary dilatation, recommend correlation with LFTs. Electronically Signed   By: Lavonia Dana M.D.   On: 02/19/2020 13:16    DG Abd 2 Views  Result Date: 02/19/2020 CLINICAL DATA:  Upper abdominal pain since 8:45 a.m. EXAM: ABDOMEN - 2 VIEW COMPARISON:  None. FINDINGS: Normal bowel gas pattern without free peritoneal air. Bilateral pelvic phleboliths. Mild scoliosis. Lumbar and lower thoracic spine degenerative changes. IMPRESSION: No acute abnormality. Electronically Signed   By: Claudie Revering M.D.   On: 02/19/2020 12:13    EKG: Independently reviewed. Normal sinus rhythm at 70 bpm  Assessment/Plan Epigastric abdominal pain Cholelithiasis: Acute. Patient presents with complaints of abdominal pain.  Ultrasound concerning for cholelithiasis with mildly prominent common bile duct and central intrahepatic biliary duct dilatation. -Admit to a medical telemetry bed -N.p.o. after midnight -Normal saline IV fluids at 75 mL/h -IV fentanyl as needed for pain -HIDA scan had been initially ordered, but reported to be unavailable on the weekend -CT scan concerning for dilation of the common bile duct measuring up to 9 mm distally. -Dr. Donne Hazel of General surgery was formally consulted, will follow-up for further recommendation  Hyperbilirubinemia: Acute.  Total bilirubin mildly elevated at 1.6. -Recheck CMP in a.m.   History of breast cancer: Patient initially diagnosed in 08/2018 status post lumpectomy with chemotherapy and radiation. -Continue outpatient follow-up with hematology oncology at [facility]  S/p gastric sleeve  DVT prophylaxis: lovenox Code Status: full Family Communication: Discussed plan of care with patient's husband Disposition Plan: To be determined Consults called: General surgery Admission status: Observation  Norval Morton MD [facility] Pager [phone]   If 7PM-7AM, please contact night-coverage [email or web address] Password [facility]  02/19/2020, 2:55 PM

## 2020-02-19 NOTE — ED Provider Notes (Signed)
[facility] EMERGENCY DEPARTMENT Provider Note   CSN: [id number] Arrival date & time: 02/19/20  0737     History Chief Complaint  Patient presents with  . Abdominal Pain    Andrea [name] is a 49 y.o. female.  HPI Patient with upper abdominal pain.  Began at around 845 this morning.  Nausea.  No vomiting or diarrhea.  States she actually is worried she could be a little constipated.  Pain is dull.  No fevers or chills.  No cough.  No sick contacts.  No dysuria.  No chest pain.  No lightheadedness or dizziness.  Has not had pains like this before.  Has had previous gastric sleeve resection.  Also previous breast cancer but states she is cancer free now.    Past Medical History:  Diagnosis Date  . Anemia   . Breast cancer ([place])   . Hypertension   . Vertigo     There are no problems to display for this patient.   Past Surgical History:  Procedure Laterality Date  . BREAST LUMPECTOMY    . KNEE SURGERY    . LAPAROSCOPIC GASTRIC SLEEVE RESECTION    . TONSILLECTOMY       OB History   No obstetric history on file.     No family history on file.  Social History   Tobacco Use  . Smoking status: Never Smoker  . Smokeless tobacco: Never Used  Substance Use Topics  . Alcohol use: No  . Drug use: No    Home Medications Prior to Admission medications   Medication Sig Start Date End Date Taking? Authorizing Provider  triamterene-hydrochlorothiazide (DYAZIDE) 37.5-25 MG per capsule Take 1 capsule by mouth daily.   Yes [provider]  cephALEXin (KEFLEX) 500 MG capsule Take 1 capsule (500 mg total) by mouth 4 (four) times daily. 12/19/17   Khatri, Hina, PA-C  naproxen (NAPROSYN) 500 MG tablet Take 1 tablet (500 mg total) by mouth 2 (two) times daily. 07/23/18   Carlisle Cater, PA-C  penicillin v potassium (VEETID) 500 MG tablet Take 1 tablet (500 mg total) by mouth 3 (three) times daily. 07/23/18   Carlisle Cater, PA-C    Allergies      Strawberry flavor  Review of Systems   Review of Systems  Constitutional: Negative for appetite change and fever.  HENT: Negative for congestion.   Respiratory: Negative for shortness of breath.   Cardiovascular: Negative for chest pain.  Gastrointestinal: Positive for abdominal pain and nausea.  Genitourinary: Negative for flank pain.  Musculoskeletal: Negative for back pain.  Skin: Negative for rash.  Neurological: Negative for weakness.  Psychiatric/Behavioral: Negative for confusion.    Physical Exam Updated Vital Signs BP (!) 175/105 (BP Location: Right Arm)   Pulse 72   Temp 98.2 F (36.8 C) (Oral)   Resp 18   Ht 5\' 1"  (1.549 m)   Wt 87.5 kg   LMP 11/24/2017   SpO2 99%   BMI 36.47 kg/m   Physical Exam Vitals and nursing note reviewed.  HENT:     Head: Atraumatic.  Cardiovascular:     Rate and Rhythm: Regular rhythm.  Pulmonary:     Breath sounds: Normal breath sounds.  Abdominal:     Tenderness: There is abdominal tenderness.     Hernia: No hernia is present.     Comments: Upper abdominal tenderness with possible mild fullness.  No rebound or guarding.  No hernia palpated.  Skin:    General: Skin is  warm.     Capillary Refill: Capillary refill takes less than 2 seconds.  Neurological:     Mental Status: She is alert and oriented to person, place, and time.     ED Results / Procedures / Treatments   Labs (all labs ordered are listed, but only abnormal results are displayed) Labs Reviewed  COMPREHENSIVE METABOLIC PANEL - Abnormal; Notable for the following components:      Result Value   Potassium 3.2 (*)    Glucose, Bld 102 (*)    Total Bilirubin 1.6 (*)    All other components within normal limits  SARS CORONAVIRUS 2 BY RT PCR (HOSPITAL ORDER, [facility])  LIPASE, BLOOD  CBC  URINALYSIS, ROUTINE W REFLEX MICROSCOPIC  I-STAT BETA HCG BLOOD, ED ([facility], [facility], [facility] ONLY)    EKG EKG Interpretation  Date/Time:  Saturday February 19 2020 09:55:23 EDT Ventricular Rate:  78 PR Interval:  174 QRS Duration: 94 QT Interval:  394 QTC Calculation: 449 R Axis:   9 Text Interpretation: Normal sinus rhythm with sinus arrhythmia Possible Anterior infarct , age undetermined Abnormal ECG Confirmed by Davonna Belling [phone]) on 02/19/2020 11:26:31 AM   Radiology US Abdomen Complete  Result Date: 02/19/2020 CLINICAL DATA:  Abdominal and epigastric pain since 0845 hours today, history hypertension and breast cancer EXAM: ABDOMEN ULTRASOUND COMPLETE COMPARISON:  None FINDINGS: Gallbladder: Multiple tiny shadowing calculi within gallbladder, largest measurable calculus 5 mm diameter. Gallbladder wall upper normal thickness. No sonographic Murphy sign or pericholecystic fluid. Common bile duct: Diameter: 8 mm, minimally prominent. Liver: Normal echogenicity without focal mass. Minimal central intrahepatic biliary dilatation. Portal vein is patent on color Doppler imaging with normal direction of blood flow towards the liver. IVC: Normal appearance Pancreas: Portions of tail obscured by bowel gas. Visualized portions normal appearance. Spleen: Normal appearance, 6.9 cm length Right Kidney: Length: 9.8 cm. Normal morphology without mass or hydronephrosis. Left Kidney: Length: 9.8 cm. Normal morphology without mass or hydronephrosis. Abdominal aorta: Normal caliber proximally, mid to distal portions obscured by bowel gas Other findings: No free fluid IMPRESSION: Cholelithiasis with upper normal gallbladder wall thickness. Mildly prominent CBD with minimal central intrahepatic biliary dilatation, recommend correlation with LFTs. Electronically Signed   By: Lavonia Dana M.D.   On: 02/19/2020 13:16   DG Abd 2 Views  Result Date: 02/19/2020 CLINICAL DATA:  Upper abdominal pain since 8:45 a.m. EXAM: ABDOMEN - 2 VIEW COMPARISON:  None. FINDINGS: Normal bowel gas pattern without free peritoneal air. Bilateral pelvic phleboliths. Mild scoliosis. Lumbar  and lower thoracic spine degenerative changes. IMPRESSION: No acute abnormality. Electronically Signed   By: Claudie Revering M.D.   On: 02/19/2020 12:13    Procedures Procedures (including critical care time)  Medications Ordered in ED Medications  sodium chloride flush (NS) 0.9 % injection 3 mL (has no administration in time range)  fentaNYL (SUBLIMAZE) injection 50 mcg (has no administration in time range)  ondansetron (ZOFRAN) injection 4 mg (4 mg Intravenous Given 02/19/20 1340)    ED Course  I have reviewed the triage vital signs and the nursing notes.  Pertinent labs & imaging results that were available during my care of the patient were reviewed by me and considered in my medical decision making (see chart for details).    MDM Rules/Calculators/A&P                          Patient presents upper abdominal pain.  Began this morning.  Nausea without vomiting or diarrhea.  States she has had a bowel movement without relief of pain.  Lab work overall reassuring but bilirubin mildly elevated.  Continued pain.  Nausea improved after treatment.  X-ray done and reassuring.  Ultrasound done and showed gallstones with upper limits normal wall and some mild intrahepatic biliary dilatation.  Labs overall reassuring.  With continued pain however feels the patient benefit from admission to the hospital for further evaluation and likely HIDA scan.  Does not have a clear PCP.  Will admit to unassigned medicine Final Clinical Impression(s) / ED Diagnoses Final diagnoses:  Abdominal pain    Rx / DC Orders ED Discharge Orders    None       Davonna Belling, MD 02/19/20 1444

## 2020-02-20 ENCOUNTER — Inpatient Hospital Stay (HOSPITAL_COMMUNITY): Payer: BC Managed Care – PPO

## 2020-02-20 LAB — CBC
HCT: 36.5 % (ref 36.0–46.0)
Hemoglobin: 12.8 g/dL (ref 12.0–15.0)
MCH: 31.2 pg (ref 26.0–34.0)
MCHC: 35.1 g/dL (ref 30.0–36.0)
MCV: 89 fL (ref 80.0–100.0)
Platelets: 157 10*3/uL (ref 150–400)
RBC: 4.1 MIL/uL (ref 3.87–5.11)
RDW: 12.1 % (ref 11.5–15.5)
WBC: 5.2 10*3/uL (ref 4.0–10.5)
nRBC: 0 % (ref 0.0–0.2)

## 2020-02-20 LAB — COMPREHENSIVE METABOLIC PANEL
ALT: 323 U/L — ABNORMAL HIGH (ref 0–44)
AST: 385 U/L — ABNORMAL HIGH (ref 15–41)
Albumin: 3.7 g/dL (ref 3.5–5.0)
Alkaline Phosphatase: 150 U/L — ABNORMAL HIGH (ref 38–126)
Anion gap: 10 (ref 5–15)
BUN: 9 mg/dL (ref 6–20)
CO2: 22 mmol/L (ref 22–32)
Calcium: 8.8 mg/dL — ABNORMAL LOW (ref 8.9–10.3)
Chloride: 106 mmol/L (ref 98–111)
Creatinine, Ser: 1.04 mg/dL — ABNORMAL HIGH (ref 0.44–1.00)
GFR calc Af Amer: >60 mL/min (ref >60)
GFR calc non Af Amer: >60 mL/min (ref >60)
Glucose, Bld: 92 mg/dL (ref 70–99)
Potassium: 3.1 mmol/L — ABNORMAL LOW (ref 3.5–5.1)
Sodium: 138 mmol/L (ref 135–145)
Total Bilirubin: 2.2 mg/dL — ABNORMAL HIGH (ref 0.3–1.2)
Total Protein: 6.5 g/dL (ref 6.5–8.1)

## 2020-02-20 LAB — HIV ANTIBODY (ROUTINE TESTING W REFLEX): HIV Screen 4th Generation wRfx: NONREACTIVE

## 2020-02-20 MED ORDER — FENTANYL CITRATE (PF) 100 MCG/2ML IJ SOLN: 25.0000 ug | INTRAMUSCULAR | Status: DC | PRN

## 2020-02-20 MED ORDER — GADOBUTROL 1 MMOL/ML IV SOLN
8.5000 mL | Freq: Once | INTRAVENOUS | Status: AC | PRN
  Administered 2020-02-20: 8.5 mL via INTRAVENOUS

## 2020-02-20 NOTE — Progress Notes (Signed)
PROGRESS NOTE    Andrea [name]  IDP:[id number] DOB: [birth year] DOA: 02/19/2020 PCP: Patient, No Pcp Per  Brief Narrative:  Andrea [name] is a 49 y.o. female with medical history significant of hypertension, anemia, s/p gastric sleeve, breast cancer s/p lumpectomy with chemo and radiation completed in March, and obesity who presented with upper abdominal pain starting 6/26 morning around 8:45 AM.   Reported having nausea with nonbloody emesis.  Work-up in the ED noted potassium 3.2 and total bilirubin 1.6.   Ultrasound of the abdomen revealed cholelithiasis with gallbladder thickening at the upper limit of normal with mildly prominent common bile duct and intrahepatic ducts   Assessment & Plan:  Symptomatic cholelithiasis General surgery consulting  -Labs with worsening bilirubin and LFTs -Needs lap chole, timing per CCS -Discussed with Dr. Hassell Done today, he recommends MRCP -Afebrile without leukocytosis, not on antibiotics at this time -Labs in a.m.  History of breast cancer: Patient initially diagnosed in 08/2018 status post lumpectomy with chemotherapy and radiation. -Continue outpatient follow-up with hematology oncology at [facility]  S/p gastric sleeve  DVT prophylaxis: lovenox Code Status: full Family Communication:  Discussed patient in detail, spouse at bedside Disposition Plan: Status is: Inpatient  Remains inpatient appropriate because:Ongoing diagnostic testing needed not appropriate for outpatient work up   Dispo: The patient is from: Home              Anticipated d/c is to: Home              Anticipated d/c date is: 2 days              Patient currently is not medically stable to d/c.  Consultants:   CCS   Procedures:   Antimicrobials:    Subjective: -Feels better today, abdominal pain is improving, denies any nausea vomiting, denies fevers  Objective: Vitals:   02/19/20 1712 02/20/20 0201 02/20/20 0523 02/20/20 1015  BP: (!) 189/114 (!) 128/93 (!)  134/98 132/88  Pulse: 82 91 75 78  Resp: 18 18 17 17   Temp: 98.2 F (36.8 C) 98.2 F (36.8 C) 97.9 F (36.6 C) 98 F (36.7 C)  TempSrc: Oral   Oral  SpO2: 100% 98% 100% 100%  Weight:      Height:       No intake or output data in the 24 hours ending 02/20/20 1126 Filed Weights   02/19/20 1003  Weight: 87.5 kg    Examination:  General exam: Obese pleasant female sitting up in bed, AAOx3 Respiratory system: Clear to auscultation. Cardiovascular system: S1 & S2 heard, RRR.   Gastrointestinal system: Abdomen is nondistended, soft and nontender.Normal bowel sounds heard. Central nervous system: Alert and oriented. No focal neurological deficits. Extremities: No edema Skin: No rashes on exposed skin Psychiatry: Mood & affect appropriate.     Data Reviewed:   CBC: Recent Labs  Lab 02/19/20 1005 02/20/20 0301  WBC 4.4 5.2  HGB 13.4 12.8  HCT 39.1 36.5  MCV 90.7 89.0  PLT 170 443   Basic Metabolic Panel: Recent Labs  Lab 02/19/20 1005 02/20/20 0301  NA 141 138  K 3.2* 3.1*  CL 106 106  CO2 25 22  GLUCOSE 102* 92  BUN 13 9  CREATININE 0.87 1.04*  CALCIUM 8.9 8.8*   GFR: Estimated Creatinine Clearance: 65.1 mL/min (A) (by C-G formula based on SCr of 1.04 mg/dL (H)). Liver Function Tests: Recent Labs  Lab 02/19/20 1005 02/20/20 0301  AST 39 385*  ALT 18 323*  ALKPHOS  96 150*  BILITOT 1.6* 2.2*  PROT 6.8 6.5  ALBUMIN 4.1 3.7   Recent Labs  Lab 02/19/20 1005  LIPASE 36   No results for input(s): AMMONIA in the last 168 hours. Coagulation Profile: No results for input(s): INR, PROTIME in the last 168 hours. Cardiac Enzymes: No results for input(s): CKTOTAL, CKMB, CKMBINDEX, TROPONINI in the last 168 hours. BNP (last 3 results) No results for input(s): PROBNP in the last 8760 hours. HbA1C: No results for input(s): HGBA1C in the last 72 hours. CBG: No results for input(s): GLUCAP in the last 168 hours. Lipid Profile: No results for input(s):  CHOL, HDL, LDLCALC, TRIG, CHOLHDL, LDLDIRECT in the last 72 hours. Thyroid Function Tests: No results for input(s): TSH, T4TOTAL, FREET4, T3FREE, THYROIDAB in the last 72 hours. Anemia Panel: No results for input(s): VITAMINB12, FOLATE, FERRITIN, TIBC, IRON, RETICCTPCT in the last 72 hours. Urine analysis:    Component Value Date/Time   COLORURINE YELLOW 02/19/2020 1015   APPEARANCEUR CLEAR 02/19/2020 1015   LABSPEC 1.013 02/19/2020 1015   [place] 7.0 02/19/2020 1015   GLUCOSEU NEGATIVE 02/19/2020 1015   HGBUR NEGATIVE 02/19/2020 1015   BILIRUBINUR NEGATIVE 02/19/2020 1015   KETONESUR NEGATIVE 02/19/2020 1015   PROTEINUR NEGATIVE 02/19/2020 1015   NITRITE NEGATIVE 02/19/2020 1015   LEUKOCYTESUR NEGATIVE 02/19/2020 1015   Sepsis Labs: @LABRCNTIP (procalcitonin:4,lacticidven:4)  ) Recent Results (from the past 240 hour(s))  SARS Coronavirus 2 by RT PCR (hospital order, performed in [place] hospital lab) Nasopharyngeal Nasopharyngeal Swab     Status: None   Collection Time: 02/19/20  3:16 PM   Specimen: Nasopharyngeal Swab  Result Value Ref Range Status   SARS Coronavirus 2 NEGATIVE NEGATIVE Final    Comment: (NOTE) SARS-CoV-2 target nucleic acids are NOT DETECTED.  The SARS-CoV-2 RNA is generally detectable in upper and lower respiratory specimens during the acute phase of infection. The lowest concentration of SARS-CoV-2 viral copies this assay can detect is 250 copies / mL. A negative result does not preclude SARS-CoV-2 infection and should not be used as the sole basis for treatment or other patient management decisions.  A negative result may occur with improper specimen collection / handling, submission of specimen other than nasopharyngeal swab, presence of viral mutation(s) within the areas targeted by this assay, and inadequate number of viral copies (<250 copies / mL). A negative result must be combined with clinical observations, patient history, and  epidemiological information.  Fact Sheet for Patients:   StrictlyIdeas.no  Fact Sheet for Healthcare Providers: BankingDealers.co.za  This [name] is not yet approved or  cleared by the [place] FDA and has been authorized for detection and/or diagnosis of SARS-CoV-2 by FDA under an Emergency Use Authorization (EUA).  This EUA will remain in effect (meaning this [name] can be used) for the duration of the COVID-19 declaration under Section 564(b)(1) of the Act, 21 U.S.C. section 360bbb-3(b)(1), unless the authorization is terminated or revoked sooner.  Performed at [facility], [place] [address], [place], [place] [address]          Radiology Studies: US Abdomen Complete  Result Date: 02/19/2020 CLINICAL DATA:  Abdominal and epigastric pain since 0845 hours today, history hypertension and breast cancer EXAM: ABDOMEN ULTRASOUND COMPLETE COMPARISON:  None FINDINGS: Gallbladder: Multiple tiny shadowing calculi within gallbladder, largest measurable calculus 5 mm diameter. Gallbladder wall upper normal thickness. No sonographic Murphy sign or pericholecystic fluid. Common bile duct: Diameter: 8 mm, minimally prominent. Liver: Normal echogenicity without focal mass. Minimal  central intrahepatic biliary dilatation. Portal vein is patent on color Doppler imaging with normal direction of blood flow towards the liver. IVC: Normal appearance Pancreas: Portions of tail obscured by bowel gas. Visualized portions normal appearance. Spleen: Normal appearance, 6.9 cm length Right Kidney: Length: 9.8 cm. Normal morphology without mass or hydronephrosis. Left Kidney: Length: 9.8 cm. Normal morphology without mass or hydronephrosis. Abdominal aorta: Normal caliber proximally, mid to distal portions obscured by bowel gas Other findings: No free fluid IMPRESSION: Cholelithiasis with upper normal gallbladder wall thickness. Mildly prominent CBD with minimal  central intrahepatic biliary dilatation, recommend correlation with LFTs. Electronically Signed   By: Lavonia Dana M.D.   On: 02/19/2020 13:16   CT ABDOMEN PELVIS W CONTRAST  Result Date: 02/19/2020 CLINICAL DATA:  Cholecystitis. EXAM: CT ABDOMEN AND PELVIS WITH CONTRAST TECHNIQUE: Multidetector CT imaging of the abdomen and pelvis was performed using the standard protocol following bolus administration of intravenous contrast. CONTRAST:  185mL OMNIPAQUE IOHEXOL 300 MG/ML  SOLN COMPARISON:  None recent. FINDINGS: Lower chest: The lung bases are clear. The heart is enlarged. Hepatobiliary: The patient's prior CT and MRI were not available for comparison. There is a 2.2 cm hypoattenuating lesion in hepatic segment 6 (axial series 3, image 24). This presumably represents the previously documented hepatic hemangioma. Cholelithiasis without acute inflammation.The common bile duct is dilated measuring approximately 9 mm distally. Pancreas: Normal contours without ductal dilatation. No peripancreatic fluid collection. Spleen: The spleen is borderline enlarged measuring approximately 12 cm. Adrenals/Urinary Tract: --Adrenal glands: Unremarkable. --Right kidney/ureter: No hydronephrosis or radiopaque kidney stones. --Left kidney/ureter: No hydronephrosis or radiopaque kidney stones. --Urinary bladder: The bladder is underdistended. There does appear to be some diffuse bladder wall thickening. Stomach/Bowel: --Stomach/Duodenum: There are postsurgical changes of the stomach. --Small bowel: Unremarkable. --Colon: Unremarkable. --Appendix: Normal. Vascular/Lymphatic: Atherosclerotic calcification is present within the non-aneurysmal abdominal aorta, without hemodynamically significant stenosis. --No retroperitoneal lymphadenopathy. --No mesenteric lymphadenopathy. --No pelvic or inguinal lymphadenopathy. Reproductive: There is apparent diffuse thickening of the endometrium. Other: No ascites or free air. There is a fat  containing umbilical hernia. Musculoskeletal. No acute displaced fractures. IMPRESSION: 1. Cholelithiasis without acute inflammation. 2. Dilated common bile duct measuring up to 9 mm distally. Correlation with laboratory studies is recommended. If there is concern for choledocholithiasis, follow-up with MRCP or ERCP is recommended. 3. Apparent diffuse thickening of the endometrium. Recommend further evaluation with nonemergent outpatient pelvic ultrasound. 4. Bladder wall thickening. Correlation with urinalysis is recommended. 5. Cardiomegaly. Aortic Atherosclerosis (ICD10-I70.0). Electronically Signed   By: Constance Holster M.D.   On: 02/19/2020 17:09   DG Abd 2 Views  Result Date: 02/19/2020 CLINICAL DATA:  Upper abdominal pain since 8:45 a.m. EXAM: ABDOMEN - 2 VIEW COMPARISON:  None. FINDINGS: Normal bowel gas pattern without free peritoneal air. Bilateral pelvic phleboliths. Mild scoliosis. Lumbar and lower thoracic spine degenerative changes. IMPRESSION: No acute abnormality. Electronically Signed   By: Claudie Revering M.D.   On: 02/19/2020 12:13        Scheduled Meds: . enoxaparin (LOVENOX) injection  40 mg Subcutaneous Q24H  . sodium chloride flush  3 mL Intravenous Q12H   Continuous Infusions: . sodium chloride 75 mL/hr at 02/20/20 0802     LOS: 1 day    Time spent: 65min  Domenic Polite, MD [facility]  02/20/2020, 11:26 AM

## 2020-02-20 NOTE — Progress Notes (Signed)
Patient ID: Andrea [name], female   DOB: 1971/04/22, 49 y.o.   MRN: [id number] [facility] Surgery Progress Note:   * No surgery found *  Subjective: Mental status is clear.  Complaints pain is less. Objective: Vital signs in last 24 hours: Temp:  [97.9 F (36.6 C)-98.2 F (36.8 C)] 97.9 F (36.6 C) (06/27 0523) Pulse Rate:  [72-91] 75 (06/27 0523) Resp:  [17-18] 17 (06/27 0523) BP: (128-189)/(93-118) 134/98 (06/27 0523) SpO2:  [98 %-100 %] 100 % (06/27 0523) Weight:  [87.5 kg] 87.5 kg (06/26 1003)  Intake/Output from previous day: No intake/output data recorded. Intake/Output this shift: No intake/output data recorded.  Physical Exam: Work of breathing is not labored.  Not complaining of pain.    Lab Results:  Results for orders placed or performed during the hospital encounter of 02/19/20 (from the past 48 hour(s))  Lipase, blood     Status: None   Collection Time: 02/19/20 10:05 AM  Result Value Ref Range   Lipase 36 11 - 51 U/L    Comment: Performed at [facility], [address], [place], [place] [address]  Comprehensive metabolic panel     Status: Abnormal   Collection Time: 02/19/20 10:05 AM  Result Value Ref Range   Sodium 141 135 - 145 mmol/L   Potassium 3.2 (L) 3.5 - 5.1 mmol/L   Chloride 106 98 - 111 mmol/L   CO2 25 22 - 32 mmol/L   Glucose, Bld 102 (H) 70 - 99 mg/dL    Comment: Glucose reference range applies only to samples taken after fasting for at least 8 hours.   BUN 13 6 - 20 mg/dL   Creatinine, Ser 0.87 0.44 - 1.00 mg/dL   Calcium 8.9 8.9 - 10.3 mg/dL   Total Protein 6.8 6.5 - 8.1 g/dL   Albumin 4.1 3.5 - 5.0 g/dL   AST 39 15 - 41 U/L   ALT 18 0 - 44 U/L   Alkaline Phosphatase 96 38 - 126 U/L   Total Bilirubin 1.6 (H) 0.3 - 1.2 mg/dL   GFR calc non Af Amer >60 >60 mL/min   GFR calc Af Amer >60 >60 mL/min   Anion gap 10 5 - 15    Comment: Performed at [facility], [place] [address] [place] [address]  CBC     Status: None    Collection Time: 02/19/20 10:05 AM  Result Value Ref Range   WBC 4.4 4.0 - 10.5 K/uL   RBC 4.31 3.87 - 5.11 MIL/uL   Hemoglobin 13.4 12.0 - 15.0 g/dL   HCT 39.1 36 - 46 %   MCV 90.7 80.0 - 100.0 fL   MCH 31.1 26.0 - 34.0 pg   MCHC 34.3 30.0 - 36.0 g/dL   RDW 12.2 11.5 - 15.5 %   Platelets 170 150 - 400 K/uL   nRBC 0.0 0.0 - 0.2 %    Comment: Performed at [facility], [place] [address] [place], [place] [address]  I-Stat beta hCG blood, ED     Status: None   Collection Time: 02/19/20 10:12 AM  Result Value Ref Range   I-stat hCG, quantitative <5.0 <5 mIU/mL   Comment 3            Comment:   GEST. AGE      CONC.  (mIU/mL)   <=1 WEEK        5 - 50     2 WEEKS  50 - 500     3 WEEKS       100 - 10,000     4 WEEKS     1,000 - 30,000        FEMALE AND NON-PREGNANT FEMALE:     LESS THAN 5 mIU/mL   Urinalysis, Routine w reflex microscopic     Status: None   Collection Time: 02/19/20 10:15 AM  Result Value Ref Range   Color, Urine YELLOW YELLOW   APPearance CLEAR CLEAR   Specific Gravity, Urine 1.013 1.005 - 1.030   pH 7.0 5.0 - 8.0   Glucose, UA NEGATIVE NEGATIVE mg/dL   Hgb urine dipstick NEGATIVE NEGATIVE   Bilirubin Urine NEGATIVE NEGATIVE   Ketones, ur NEGATIVE NEGATIVE mg/dL   Protein, ur NEGATIVE NEGATIVE mg/dL   Nitrite NEGATIVE NEGATIVE   Leukocytes,Ua NEGATIVE NEGATIVE    Comment: Performed at [place] [address] [place], [place] [address]  SARS Coronavirus 2 by RT PCR (hospital order, performed in [facility] hospital lab) Nasopharyngeal Nasopharyngeal Swab     Status: None   Collection Time: 02/19/20  3:16 PM   Specimen: Nasopharyngeal Swab  Result Value Ref Range   SARS Coronavirus 2 NEGATIVE NEGATIVE    Comment: (NOTE) SARS-CoV-2 target nucleic acids are NOT DETECTED.  The SARS-CoV-2 RNA is generally detectable in upper and lower respiratory specimens during the acute phase of infection. The lowest concentration of SARS-CoV-2 viral  copies this assay can detect is 250 copies / mL. A negative result does not preclude SARS-CoV-2 infection and should not be used as the sole basis for treatment or other patient management decisions.  A negative result may occur with improper specimen collection / handling, submission of specimen other than nasopharyngeal swab, presence of viral mutation(s) within the areas targeted by this assay, and inadequate number of viral copies (<250 copies / mL). A negative result must be combined with clinical observations, patient history, and epidemiological information.  Fact Sheet for Patients:   StrictlyIdeas.no  Fact Sheet for Healthcare Providers: BankingDealers.co.za  This test is not yet approved or  cleared by the [place] FDA and has been authorized for detection and/or diagnosis of SARS-CoV-2 by FDA under an Emergency Use Authorization (EUA).  This EUA will remain in effect (meaning this test can be used) for the duration of the COVID-19 declaration under Section 564(b)(1) of the Act, 21 U.S.C. section 360bbb-3(b)(1), unless the authorization is terminated or revoked sooner.  Performed at [facility], [place] [address] [place] [address]   CBC     Status: None   Collection Time: 02/20/20  3:01 AM  Result Value Ref Range   WBC 5.2 4.0 - 10.5 K/uL   RBC 4.10 3.87 - 5.11 MIL/uL   Hemoglobin 12.8 12.0 - 15.0 g/dL   HCT 36.5 36 - 46 %   MCV 89.0 80.0 - 100.0 fL   MCH 31.2 26.0 - 34.0 pg   MCHC 35.1 30.0 - 36.0 g/dL   RDW 12.1 11.5 - 15.5 %   Platelets 157 150 - 400 K/uL   nRBC 0.0 0.0 - 0.2 %    Comment: Performed at [facility], [place] [address] [place], [place] [address]  Comprehensive metabolic panel     Status: Abnormal   Collection Time: 02/20/20  3:01 AM  Result Value Ref Range   Sodium 138 135 - 145 mmol/L   Potassium 3.1 (L) 3.5 - 5.1 mmol/L   Chloride  106 98 - 111 mmol/L   CO2 22 22 - 32  mmol/L   Glucose, Bld 92 70 - 99 mg/dL    Comment: Glucose reference range applies only to samples taken after fasting for at least 8 hours.   BUN 9 6 - 20 mg/dL   Creatinine, Ser 1.04 (H) 0.44 - 1.00 mg/dL   Calcium 8.8 (L) 8.9 - 10.3 mg/dL   Total Protein 6.5 6.5 - 8.1 g/dL   Albumin 3.7 3.5 - 5.0 g/dL   AST 385 (H) 15 - 41 U/L   ALT 323 (H) 0 - 44 U/L   Alkaline Phosphatase 150 (H) 38 - 126 U/L   Total Bilirubin 2.2 (H) 0.3 - 1.2 mg/dL   GFR calc non Af Amer >60 >60 mL/min   GFR calc Af Amer >60 >60 mL/min   Anion gap 10 5 - 15    Comment: Performed at [facility], [place] [address], [place], [place] [address]    Radiology/Results: US Abdomen Complete  Result Date: 02/19/2020 CLINICAL DATA:  Abdominal and epigastric pain since 0845 hours today, history hypertension and breast cancer EXAM: ABDOMEN ULTRASOUND COMPLETE COMPARISON:  None FINDINGS: Gallbladder: Multiple tiny shadowing calculi within gallbladder, largest measurable calculus 5 mm diameter. Gallbladder wall upper normal thickness. No sonographic Murphy sign or pericholecystic fluid. Common bile duct: Diameter: 8 mm, minimally prominent. Liver: Normal echogenicity without focal mass. Minimal central intrahepatic biliary dilatation. Portal vein is patent on color Doppler imaging with normal direction of blood flow towards the liver. IVC: Normal appearance Pancreas: Portions of tail obscured by bowel gas. Visualized portions normal appearance. Spleen: Normal appearance, 6.9 cm length Right Kidney: Length: 9.8 cm. Normal morphology without mass or hydronephrosis. Left Kidney: Length: 9.8 cm. Normal morphology without mass or hydronephrosis. Abdominal aorta: Normal caliber proximally, mid to distal portions obscured by bowel gas Other findings: No free fluid IMPRESSION: Cholelithiasis with upper normal gallbladder wall thickness. Mildly prominent CBD with minimal central intrahepatic biliary dilatation, recommend correlation with  LFTs. Electronically Signed   By: Lavonia Dana M.D.   On: 02/19/2020 13:16   CT ABDOMEN PELVIS W CONTRAST  Result Date: 02/19/2020 CLINICAL DATA:  Cholecystitis. EXAM: CT ABDOMEN AND PELVIS WITH CONTRAST TECHNIQUE: Multidetector CT imaging of the abdomen and pelvis was performed using the standard protocol following bolus administration of intravenous contrast. CONTRAST:  171mL OMNIPAQUE IOHEXOL 300 MG/ML  SOLN COMPARISON:  None recent. FINDINGS: Lower chest: The lung bases are clear. The heart is enlarged. Hepatobiliary: The patient's prior CT and MRI were not available for comparison. There is a 2.2 cm hypoattenuating lesion in hepatic segment 6 (axial series 3, image 24). This presumably represents the previously documented hepatic hemangioma. Cholelithiasis without acute inflammation.The common bile duct is dilated measuring approximately 9 mm distally. Pancreas: Normal contours without ductal dilatation. No peripancreatic fluid collection. Spleen: The spleen is borderline enlarged measuring approximately 12 cm. Adrenals/Urinary Tract: --Adrenal glands: Unremarkable. --Right kidney/ureter: No hydronephrosis or radiopaque kidney stones. --Left kidney/ureter: No hydronephrosis or radiopaque kidney stones. --Urinary bladder: The bladder is underdistended. There does appear to be some diffuse bladder wall thickening. Stomach/Bowel: --Stomach/Duodenum: There are postsurgical changes of the stomach. --Small bowel: Unremarkable. --Colon: Unremarkable. --Appendix: Normal. Vascular/Lymphatic: Atherosclerotic calcification is present within the non-aneurysmal abdominal aorta, without hemodynamically significant stenosis. --No retroperitoneal lymphadenopathy. --No mesenteric lymphadenopathy. --No pelvic or inguinal lymphadenopathy. Reproductive: There is apparent diffuse thickening of the endometrium. Other: No ascites or free air. There is a fat containing umbilical hernia. Musculoskeletal.  No acute displaced  fractures. IMPRESSION: 1. Cholelithiasis without acute inflammation. 2. Dilated common bile duct measuring up to 9 mm distally. Correlation with laboratory studies is recommended. If there is concern for choledocholithiasis, follow-up with MRCP or ERCP is recommended. 3. Apparent diffuse thickening of the endometrium. Recommend further evaluation with nonemergent outpatient pelvic ultrasound. 4. Bladder wall thickening. Correlation with urinalysis is recommended. 5. Cardiomegaly. Aortic Atherosclerosis (ICD10-I70.0). Electronically Signed   By: Constance Holster M.D.   On: 02/19/2020 17:09   DG Abd 2 Views  Result Date: 02/19/2020 CLINICAL DATA:  Upper abdominal pain since 8:45 a.m. EXAM: ABDOMEN - 2 VIEW COMPARISON:  None. FINDINGS: Normal bowel gas pattern without free peritoneal air. Bilateral pelvic phleboliths. Mild scoliosis. Lumbar and lower thoracic spine degenerative changes. IMPRESSION: No acute abnormality. Electronically Signed   By: Claudie Revering M.D.   On: 02/19/2020 12:13    Anti-infectives: Anti-infectives (From admission, onward)   None      Assessment/Plan: Problem List: Patient Active Problem List   Diagnosis Date Noted  . Abdominal pain 02/19/2020  . History of breast cancer 02/19/2020  . Cholelithiasis 02/19/2020  . 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   * No surgery found *    LOS: 1 day   Matt B. Hassell Done, MD, [facility] [phone] to reach the surgeon on call.    02/20/2020 9:21 AM

## 2020-02-20 NOTE — H&P (View-Only) (Signed)
Patient ID: Andrea [name], female   DOB: 01-14-1971, 49 y.o.   MRN: [id number] [facility] Surgery Progress Note:   * No surgery found *  Subjective: Mental status is clear.  Complaints pain is less. Objective: Vital signs in last 24 hours: Temp:  [97.9 F (36.6 C)-98.2 F (36.8 C)] 97.9 F (36.6 C) (06/27 0523) Pulse Rate:  [72-91] 75 (06/27 0523) Resp:  [17-18] 17 (06/27 0523) BP: (128-189)/(93-118) 134/98 (06/27 0523) SpO2:  [98 %-100 %] 100 % (06/27 0523) Weight:  [87.5 kg] 87.5 kg (06/26 1003)  Intake/Output from previous day: No intake/output data recorded. Intake/Output this shift: No intake/output data recorded.  Physical Exam: Work of breathing is not labored.  Not complaining of pain.    Lab Results:  Results for orders placed or performed during the hospital encounter of 02/19/20 (from the past 48 hour(s))  Lipase, blood     Status: None   Collection Time: 02/19/20 10:05 AM  Result Value Ref Range   Lipase 36 11 - 51 U/L    Comment: Performed at [facility], [address] [place], [place] [address]  Comprehensive metabolic panel     Status: Abnormal   Collection Time: 02/19/20 10:05 AM  Result Value Ref Range   Sodium 141 135 - 145 mmol/L   Potassium 3.2 (L) 3.5 - 5.1 mmol/L   Chloride 106 98 - 111 mmol/L   CO2 25 22 - 32 mmol/L   Glucose, Bld 102 (H) 70 - 99 mg/dL    Comment: Glucose reference range applies only to samples taken after fasting for at least 8 hours.   BUN 13 6 - 20 mg/dL   Creatinine, Ser 0.87 0.44 - 1.00 mg/dL   Calcium 8.9 8.9 - 10.3 mg/dL   Total Protein 6.8 6.5 - 8.1 g/dL   Albumin 4.1 3.5 - 5.0 g/dL   AST 39 15 - 41 U/L   ALT 18 0 - 44 U/L   Alkaline Phosphatase 96 38 - 126 U/L   Total Bilirubin 1.6 (H) 0.3 - 1.2 mg/dL   GFR calc non Af Amer >60 >60 mL/min   GFR calc Af Amer >60 >60 mL/min   Anion gap 10 5 - 15    Comment: Performed at [facility], [employer] [address], [place] [address]  CBC     Status: None    Collection Time: 02/19/20 10:05 AM  Result Value Ref Range   WBC 4.4 4.0 - 10.5 K/uL   RBC 4.31 3.87 - 5.11 MIL/uL   Hemoglobin 13.4 12.0 - 15.0 g/dL   HCT 39.1 36 - 46 %   MCV 90.7 80.0 - 100.0 fL   MCH 31.1 26.0 - 34.0 pg   MCHC 34.3 30.0 - 36.0 g/dL   RDW 12.2 11.5 - 15.5 %   Platelets 170 150 - 400 K/uL   nRBC 0.0 0.0 - 0.2 %    Comment: Performed at [facility], [place] [address], [place], [place] [address]  I-Stat beta hCG blood, ED     Status: None   Collection Time: 02/19/20 10:12 AM  Result Value Ref Range   I-stat hCG, quantitative <5.0 <5 mIU/mL   Comment 3            Comment:   GEST. AGE      CONC.  (mIU/mL)   <=1 WEEK        5 - 50     2 WEEKS  50 - 500     3 WEEKS       100 - 10,000     4 WEEKS     1,000 - 30,000        FEMALE AND NON-PREGNANT FEMALE:     LESS THAN 5 mIU/mL   Urinalysis, Routine w reflex microscopic     Status: None   Collection Time: 02/19/20 10:15 AM  Result Value Ref Range   Color, Urine YELLOW YELLOW   APPearance CLEAR CLEAR   Specific Gravity, Urine 1.013 1.005 - 1.030   pH 7.0 5.0 - 8.0   Glucose, UA NEGATIVE NEGATIVE mg/dL   Hgb urine dipstick NEGATIVE NEGATIVE   Bilirubin Urine NEGATIVE NEGATIVE   Ketones, ur NEGATIVE NEGATIVE mg/dL   Protein, ur NEGATIVE NEGATIVE mg/dL   Nitrite NEGATIVE NEGATIVE   Leukocytes,Ua NEGATIVE NEGATIVE    Comment: Performed at [facility] [address] [place], [place] [address]  SARS Coronavirus 2 by RT PCR (hospital order, performed in [facility] hospital lab) Nasopharyngeal Nasopharyngeal Swab     Status: None   Collection Time: 02/19/20  3:16 PM   Specimen: Nasopharyngeal Swab  Result Value Ref Range   SARS Coronavirus 2 NEGATIVE NEGATIVE    Comment: (NOTE) SARS-CoV-2 target nucleic acids are NOT DETECTED.  The SARS-CoV-2 RNA is generally detectable in upper and lower respiratory specimens during the acute phase of infection. The lowest concentration of SARS-CoV-2 viral  copies this assay can detect is 250 copies / mL. A negative result does not preclude SARS-CoV-2 infection and should not be used as the sole basis for treatment or other patient management decisions.  A negative result may occur with improper specimen collection / handling, submission of specimen other than nasopharyngeal swab, presence of viral mutation(s) within the areas targeted by this assay, and inadequate number of viral copies (<250 copies / mL). A negative result must be combined with clinical observations, patient history, and epidemiological information.  Fact Sheet for Patients:   StrictlyIdeas.no  Fact Sheet for Healthcare Providers: BankingDealers.co.za  This test is not yet approved or  cleared by the [place] FDA and has been authorized for detection and/or diagnosis of SARS-CoV-2 by FDA under an Emergency Use Authorization (EUA).  This EUA will remain in effect (meaning this test can be used) for the duration of the COVID-19 declaration under Section 564(b)(1) of the Act, 21 U.S.C. section 360bbb-3(b)(1), unless the authorization is terminated or revoked sooner.  Performed at [facility], [place] [address], [place] [address]   CBC     Status: None   Collection Time: 02/20/20  3:01 AM  Result Value Ref Range   WBC 5.2 4.0 - 10.5 K/uL   RBC 4.10 3.87 - 5.11 MIL/uL   Hemoglobin 12.8 12.0 - 15.0 g/dL   HCT 36.5 36 - 46 %   MCV 89.0 80.0 - 100.0 fL   MCH 31.2 26.0 - 34.0 pg   MCHC 35.1 30.0 - 36.0 g/dL   RDW 12.1 11.5 - 15.5 %   Platelets 157 150 - 400 K/uL   nRBC 0.0 0.0 - 0.2 %    Comment: Performed at [facility], [place] [address], [place], [place] [address]  Comprehensive metabolic panel     Status: Abnormal   Collection Time: 02/20/20  3:01 AM  Result Value Ref Range   Sodium 138 135 - 145 mmol/L   Potassium 3.1 (L) 3.5 - 5.1 mmol/L   Chloride  106 98 - 111 mmol/L   CO2 22 22 - 32  mmol/L   Glucose, Bld 92 70 - 99 mg/dL    Comment: Glucose reference range applies only to samples taken after fasting for at least 8 hours.   BUN 9 6 - 20 mg/dL   Creatinine, Ser 1.04 (H) 0.44 - 1.00 mg/dL   Calcium 8.8 (L) 8.9 - 10.3 mg/dL   Total Protein 6.5 6.5 - 8.1 g/dL   Albumin 3.7 3.5 - 5.0 g/dL   AST 385 (H) 15 - 41 U/L   ALT 323 (H) 0 - 44 U/L   Alkaline Phosphatase 150 (H) 38 - 126 U/L   Total Bilirubin 2.2 (H) 0.3 - 1.2 mg/dL   GFR calc non Af Amer >60 >60 mL/min   GFR calc Af Amer >60 >60 mL/min   Anion gap 10 5 - 15    Comment: Performed at [facility], [place] [address] [place], [place] [address]    Radiology/Results: US Abdomen Complete  Result Date: 02/19/2020 CLINICAL DATA:  Abdominal and epigastric pain since 0845 hours today, history hypertension and breast cancer EXAM: ABDOMEN ULTRASOUND COMPLETE COMPARISON:  None FINDINGS: Gallbladder: Multiple tiny shadowing calculi within gallbladder, largest measurable calculus 5 mm diameter. Gallbladder wall upper normal thickness. No sonographic Murphy sign or pericholecystic fluid. Common bile duct: Diameter: 8 mm, minimally prominent. Liver: Normal echogenicity without focal mass. Minimal central intrahepatic biliary dilatation. Portal vein is patent on color Doppler imaging with normal direction of blood flow towards the liver. IVC: Normal appearance Pancreas: Portions of tail obscured by bowel gas. Visualized portions normal appearance. Spleen: Normal appearance, 6.9 cm length Right Kidney: Length: 9.8 cm. Normal morphology without mass or hydronephrosis. Left Kidney: Length: 9.8 cm. Normal morphology without mass or hydronephrosis. Abdominal aorta: Normal caliber proximally, mid to distal portions obscured by bowel gas Other findings: No free fluid IMPRESSION: Cholelithiasis with upper normal gallbladder wall thickness. Mildly prominent CBD with minimal central intrahepatic biliary dilatation, recommend correlation with  LFTs. Electronically Signed   By: Lavonia Dana M.D.   On: 02/19/2020 13:16   CT ABDOMEN PELVIS W CONTRAST  Result Date: 02/19/2020 CLINICAL DATA:  Cholecystitis. EXAM: CT ABDOMEN AND PELVIS WITH CONTRAST TECHNIQUE: Multidetector CT imaging of the abdomen and pelvis was performed using the standard protocol following bolus administration of intravenous contrast. CONTRAST:  162mL OMNIPAQUE IOHEXOL 300 MG/ML  SOLN COMPARISON:  None recent. FINDINGS: Lower chest: The lung bases are clear. The heart is enlarged. Hepatobiliary: The patient's prior CT and MRI were not available for comparison. There is a 2.2 cm hypoattenuating lesion in hepatic segment 6 (axial series 3, image 24). This presumably represents the previously documented hepatic hemangioma. Cholelithiasis without acute inflammation.The common bile duct is dilated measuring approximately 9 mm distally. Pancreas: Normal contours without ductal dilatation. No peripancreatic fluid collection. Spleen: The spleen is borderline enlarged measuring approximately 12 cm. Adrenals/Urinary Tract: --Adrenal glands: Unremarkable. --Right kidney/ureter: No hydronephrosis or radiopaque kidney stones. --Left kidney/ureter: No hydronephrosis or radiopaque kidney stones. --Urinary bladder: The bladder is underdistended. There does appear to be some diffuse bladder wall thickening. Stomach/Bowel: --Stomach/Duodenum: There are postsurgical changes of the stomach. --Small bowel: Unremarkable. --Colon: Unremarkable. --Appendix: Normal. Vascular/Lymphatic: Atherosclerotic calcification is present within the non-aneurysmal abdominal aorta, without hemodynamically significant stenosis. --No retroperitoneal lymphadenopathy. --No mesenteric lymphadenopathy. --No pelvic or inguinal lymphadenopathy. Reproductive: There is apparent diffuse thickening of the endometrium. Other: No ascites or free air. There is a fat containing umbilical hernia. Musculoskeletal.  No acute displaced  fractures. IMPRESSION: 1. Cholelithiasis without acute inflammation. 2. Dilated common bile duct measuring up to 9 mm distally. Correlation with laboratory studies is recommended. If there is concern for choledocholithiasis, follow-up with MRCP or ERCP is recommended. 3. Apparent diffuse thickening of the endometrium. Recommend further evaluation with nonemergent outpatient pelvic ultrasound. 4. Bladder wall thickening. Correlation with urinalysis is recommended. 5. Cardiomegaly. Aortic Atherosclerosis (ICD10-I70.0). Electronically Signed   By: Constance Holster M.D.   On: 02/19/2020 17:09   DG Abd 2 Views  Result Date: 02/19/2020 CLINICAL DATA:  Upper abdominal pain since 8:45 a.m. EXAM: ABDOMEN - 2 VIEW COMPARISON:  None. FINDINGS: Normal bowel gas pattern without free peritoneal air. Bilateral pelvic phleboliths. Mild scoliosis. Lumbar and lower thoracic spine degenerative changes. IMPRESSION: No acute abnormality. Electronically Signed   By: Claudie Revering M.D.   On: 02/19/2020 12:13    Anti-infectives: Anti-infectives (From admission, onward)   None      Assessment/Plan: Problem List: Patient Active Problem List   Diagnosis Date Noted  . Abdominal pain 02/19/2020  . History of breast cancer 02/19/2020  . Cholelithiasis 02/19/2020  . 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   * No surgery found *    LOS: 1 day   Matt B. Hassell Done, MD, [facility] [phone] to reach the surgeon on call.    02/20/2020 9:21 AM

## 2020-02-20 NOTE — Plan of Care (Signed)
New careplan started 

## 2020-02-20 NOTE — Plan of Care (Signed)

## 2020-02-21 ENCOUNTER — Encounter (HOSPITAL_COMMUNITY): Payer: Self-pay | Admitting: Internal Medicine

## 2020-02-21 ENCOUNTER — Encounter (HOSPITAL_COMMUNITY)
Admission: EM | Disposition: A | Discharge status: Home / Self Care | Payer: Self-pay | Source: Home / Self Care | Attending: Internal Medicine

## 2020-02-21 ENCOUNTER — Inpatient Hospital Stay (HOSPITAL_COMMUNITY): Payer: BC Managed Care – PPO | Admitting: Certified Registered Nurse Anesthetist

## 2020-02-21 HISTORY — PX: CHOLECYSTECTOMY: SHX55

## 2020-02-21 LAB — COMPREHENSIVE METABOLIC PANEL
ALT: 180 U/L — ABNORMAL HIGH (ref 0–44)
AST: 109 U/L — ABNORMAL HIGH (ref 15–41)
Albumin: 3.3 g/dL — ABNORMAL LOW (ref 3.5–5.0)
Alkaline Phosphatase: 121 U/L (ref 38–126)
Anion gap: 9 (ref 5–15)
BUN: 10 mg/dL (ref 6–20)
CO2: 22 mmol/L (ref 22–32)
Calcium: 8.5 mg/dL — ABNORMAL LOW (ref 8.9–10.3)
Chloride: 111 mmol/L (ref 98–111)
Creatinine, Ser: 0.87 mg/dL (ref 0.44–1.00)
GFR calc Af Amer: >60 mL/min (ref >60)
GFR calc non Af Amer: >60 mL/min (ref >60)
Glucose, Bld: 86 mg/dL (ref 70–99)
Potassium: 3 mmol/L — ABNORMAL LOW (ref 3.5–5.1)
Sodium: 142 mmol/L (ref 135–145)
Total Bilirubin: 1.7 mg/dL — ABNORMAL HIGH (ref 0.3–1.2)
Total Protein: 6 g/dL — ABNORMAL LOW (ref 6.5–8.1)

## 2020-02-21 LAB — CBC
HCT: 34.8 % — ABNORMAL LOW (ref 36.0–46.0)
Hemoglobin: 11.9 g/dL — ABNORMAL LOW (ref 12.0–15.0)
MCH: 30.9 pg (ref 26.0–34.0)
MCHC: 34.2 g/dL (ref 30.0–36.0)
MCV: 90.4 fL (ref 80.0–100.0)
Platelets: 138 10*3/uL — ABNORMAL LOW (ref 150–400)
RBC: 3.85 MIL/uL — ABNORMAL LOW (ref 3.87–5.11)
RDW: 12.2 % (ref 11.5–15.5)
WBC: 4.5 10*3/uL (ref 4.0–10.5)
nRBC: 0 % (ref 0.0–0.2)

## 2020-02-21 LAB — SURGICAL PCR SCREEN
MRSA, PCR: NEGATIVE
Staphylococcus aureus: NEGATIVE

## 2020-02-21 SURGERY — LAPAROSCOPIC CHOLECYSTECTOMY
Anesthesia: General | Site: Abdomen

## 2020-02-21 MED ORDER — EPHEDRINE 5 MG/ML INJ
INTRAVENOUS | Status: AC
  Filled 2020-02-21: qty 10

## 2020-02-21 MED ORDER — ONDANSETRON HCL 4 MG/2ML IJ SOLN
INTRAMUSCULAR | Status: AC
  Filled 2020-02-21: qty 2

## 2020-02-21 MED ORDER — CHLORHEXIDINE GLUCONATE 0.12 % MT SOLN: 15.0000 mL | Freq: Once | OROMUCOSAL | Status: AC

## 2020-02-21 MED ORDER — SODIUM CHLORIDE 0.9 % IV SOLN
2.0000 g | INTRAVENOUS | Status: DC
  Administered 2020-02-21: 2 g via INTRAVENOUS
  Filled 2020-02-21 (×3): qty 20

## 2020-02-21 MED ORDER — PROPOFOL 10 MG/ML IV BOLUS
INTRAVENOUS | Status: AC
  Filled 2020-02-21: qty 20

## 2020-02-21 MED ORDER — ONDANSETRON HCL 4 MG/2ML IJ SOLN
INTRAMUSCULAR | Status: DC | PRN
  Administered 2020-02-21: 4 mg via INTRAVENOUS

## 2020-02-21 MED ORDER — BUPIVACAINE HCL (PF) 0.25 % IJ SOLN
INTRAMUSCULAR | Status: AC
  Filled 2020-02-21: qty 30

## 2020-02-21 MED ORDER — FENTANYL CITRATE (PF) 250 MCG/5ML IJ SOLN
INTRAMUSCULAR | Status: DC | PRN
  Administered 2020-02-21: 50 ug via INTRAVENOUS
  Administered 2020-02-21: 150 ug via INTRAVENOUS

## 2020-02-21 MED ORDER — LIDOCAINE 2% (20 MG/ML) 5 ML SYRINGE
INTRAMUSCULAR | Status: AC
  Filled 2020-02-21: qty 5

## 2020-02-21 MED ORDER — PHENYLEPHRINE 40 MCG/ML (10ML) SYRINGE FOR IV PUSH (FOR BLOOD PRESSURE SUPPORT)
PREFILLED_SYRINGE | INTRAVENOUS | Status: DC | PRN
  Administered 2020-02-21 (×2): 80 ug via INTRAVENOUS

## 2020-02-21 MED ORDER — MIDAZOLAM HCL 2 MG/2ML IJ SOLN
INTRAMUSCULAR | Status: AC
  Filled 2020-02-21: qty 2

## 2020-02-21 MED ORDER — DEXMEDETOMIDINE HCL 200 MCG/2ML IV SOLN
INTRAVENOUS | Status: DC | PRN
  Administered 2020-02-21: 8 ug via INTRAVENOUS

## 2020-02-21 MED ORDER — ACETAMINOPHEN 500 MG PO TABS
ORAL_TABLET | ORAL | Status: AC
  Filled 2020-02-21: qty 2

## 2020-02-21 MED ORDER — CHLORHEXIDINE GLUCONATE 0.12 % MT SOLN
OROMUCOSAL | Status: AC
  Administered 2020-02-21: 15 mL via OROMUCOSAL
  Filled 2020-02-21: qty 15

## 2020-02-21 MED ORDER — CELECOXIB 100 MG PO CAPS
ORAL_CAPSULE | ORAL | Status: DC | PRN
  Administered 2020-02-21: 200 mg via ORAL

## 2020-02-21 MED ORDER — LACTATED RINGERS IV SOLN: INTRAVENOUS | Status: DC | PRN

## 2020-02-21 MED ORDER — LABETALOL HCL 5 MG/ML IV SOLN
5.0000 mg | INTRAVENOUS | Status: DC | PRN
  Administered 2020-02-21 (×2): 5 mg via INTRAVENOUS

## 2020-02-21 MED ORDER — HYDRALAZINE HCL 20 MG/ML IJ SOLN
INTRAMUSCULAR | Status: AC
  Administered 2020-02-21: 5 mg via INTRAVENOUS
  Filled 2020-02-21: qty 1

## 2020-02-21 MED ORDER — OXYCODONE HCL 5 MG PO TABS
5.0000 mg | ORAL_TABLET | ORAL | Status: DC | PRN
  Administered 2020-02-21 – 2020-02-22 (×4): 10 mg via ORAL
  Filled 2020-02-21 (×4): qty 2

## 2020-02-21 MED ORDER — ROCURONIUM BROMIDE 10 MG/ML (PF) SYRINGE
PREFILLED_SYRINGE | INTRAVENOUS | Status: DC | PRN
  Administered 2020-02-21: 60 mg via INTRAVENOUS

## 2020-02-21 MED ORDER — HYDROMORPHONE HCL 1 MG/ML IJ SOLN
INTRAMUSCULAR | Status: AC
  Administered 2020-02-21: 0.5 mg via INTRAVENOUS
  Filled 2020-02-21: qty 1

## 2020-02-21 MED ORDER — SUGAMMADEX SODIUM 200 MG/2ML IV SOLN
INTRAVENOUS | Status: DC | PRN
  Administered 2020-02-21: 200 mg via INTRAVENOUS

## 2020-02-21 MED ORDER — MORPHINE SULFATE (PF) 2 MG/ML IV SOLN
2.0000 mg | INTRAVENOUS | Status: DC | PRN
  Administered 2020-02-21: 2 mg via INTRAVENOUS
  Filled 2020-02-21: qty 1

## 2020-02-21 MED ORDER — CELECOXIB 200 MG PO CAPS
ORAL_CAPSULE | ORAL | Status: AC
  Filled 2020-02-21: qty 1

## 2020-02-21 MED ORDER — INDOCYANINE GREEN 25 MG IV SOLR
INTRAVENOUS | Status: DC | PRN
  Administered 2020-02-21: 2.5 mg via INTRAVENOUS

## 2020-02-21 MED ORDER — LACTATED RINGERS IV SOLN: INTRAVENOUS | Status: DC

## 2020-02-21 MED ORDER — 0.9 % SODIUM CHLORIDE (POUR BTL) OPTIME
TOPICAL | Status: DC | PRN
  Administered 2020-02-21: 1000 mL

## 2020-02-21 MED ORDER — ACETAMINOPHEN 325 MG PO TABS
ORAL_TABLET | ORAL | Status: DC | PRN
Start: 2020-02-21 — End: 2020-02-21
  Administered 2020-02-21: 1000 mg via ORAL

## 2020-02-21 MED ORDER — AMLODIPINE BESYLATE 10 MG PO TABS
10.0000 mg | ORAL_TABLET | Freq: Every day | ORAL | Status: DC
  Administered 2020-02-21: 10 mg via ORAL
  Filled 2020-02-21: qty 1

## 2020-02-21 MED ORDER — DEXAMETHASONE SODIUM PHOSPHATE 10 MG/ML IJ SOLN
INTRAMUSCULAR | Status: DC | PRN
  Administered 2020-02-21: 5 mg via INTRAVENOUS

## 2020-02-21 MED ORDER — FENTANYL CITRATE (PF) 250 MCG/5ML IJ SOLN
INTRAMUSCULAR | Status: AC
  Filled 2020-02-21: qty 5

## 2020-02-21 MED ORDER — HYDRALAZINE HCL 20 MG/ML IJ SOLN: 10.0000 mg | Freq: Four times a day (QID) | INTRAMUSCULAR | Status: DC | PRN

## 2020-02-21 MED ORDER — MIDAZOLAM HCL 5 MG/5ML IJ SOLN
INTRAMUSCULAR | Status: DC | PRN
  Administered 2020-02-21: 2 mg via INTRAVENOUS

## 2020-02-21 MED ORDER — LIDOCAINE 2% (20 MG/ML) 5 ML SYRINGE
INTRAMUSCULAR | Status: DC | PRN
  Administered 2020-02-21: 60 mg via INTRAVENOUS

## 2020-02-21 MED ORDER — POTASSIUM CHLORIDE CRYS ER 20 MEQ PO TBCR: 40.0000 meq | EXTENDED_RELEASE_TABLET | Freq: Once | ORAL | Status: DC

## 2020-02-21 MED ORDER — HYDROMORPHONE HCL 1 MG/ML IJ SOLN
0.2500 mg | INTRAMUSCULAR | Status: DC | PRN
  Administered 2020-02-21 (×2): 0.5 mg via INTRAVENOUS

## 2020-02-21 MED ORDER — EPHEDRINE SULFATE-NACL 50-0.9 MG/10ML-% IV SOSY
PREFILLED_SYRINGE | INTRAVENOUS | Status: DC | PRN
  Administered 2020-02-21: 5 mg via INTRAVENOUS

## 2020-02-21 MED ORDER — SODIUM CHLORIDE 0.9 % IR SOLN
Status: DC | PRN
  Administered 2020-02-21: 1000 mL

## 2020-02-21 MED ORDER — HYDRALAZINE HCL 20 MG/ML IJ SOLN
5.0000 mg | Freq: Once | INTRAMUSCULAR | Status: AC
  Administered 2020-02-21: 5 mg via INTRAVENOUS

## 2020-02-21 MED ORDER — DEXAMETHASONE SODIUM PHOSPHATE 10 MG/ML IJ SOLN
INTRAMUSCULAR | Status: AC
  Filled 2020-02-21: qty 1

## 2020-02-21 MED ORDER — ENOXAPARIN SODIUM 40 MG/0.4ML ~~LOC~~ SOLN
40.0000 mg | SUBCUTANEOUS | Status: DC
  Administered 2020-02-22 – 2020-02-23 (×2): 40 mg via SUBCUTANEOUS
  Filled 2020-02-21 (×2): qty 0.4

## 2020-02-21 MED ORDER — PROPOFOL 10 MG/ML IV BOLUS
INTRAVENOUS | Status: DC | PRN
  Administered 2020-02-21: 150 mg via INTRAVENOUS

## 2020-02-21 MED ORDER — LABETALOL HCL 5 MG/ML IV SOLN
INTRAVENOUS | Status: DC | PRN
Start: 2020-02-21 — End: 2020-02-21
  Administered 2020-02-21: 10 mg via INTRAVENOUS

## 2020-02-21 MED ORDER — BUPIVACAINE HCL (PF) 0.25 % IJ SOLN
INTRAMUSCULAR | Status: DC | PRN
Start: 2020-02-21 — End: 2020-02-21
  Administered 2020-02-21: 11 mL

## 2020-02-21 SURGICAL SUPPLY — 38 items
APPLIER CLIP ROT 10 11.4 M/L (STAPLE) ×3
BLADE SURG 11 STRL SS (BLADE) ×2 IMPLANT
CANISTER SUCT 3000ML PPV (MISCELLANEOUS) ×3 IMPLANT
CHLORAPREP W/TINT 26 (MISCELLANEOUS) ×3 IMPLANT
CLIP APPLIE ROT 10 11.4 M/L (STAPLE) ×1 IMPLANT
COVER SURGICAL LIGHT HANDLE (MISCELLANEOUS) ×3 IMPLANT
COVER WAND RF STERILE (DRAPES) ×3 IMPLANT
DERMABOND ADVANCED (GAUZE/BANDAGES/DRESSINGS) ×2
DERMABOND ADVANCED .7 DNX12 (GAUZE/BANDAGES/DRESSINGS) ×1 IMPLANT
ELECT REM PT RETURN 9FT ADLT (ELECTROSURGICAL) ×3
ELECTRODE REM PT RTRN 9FT ADLT (ELECTROSURGICAL) ×1 IMPLANT
GLOVE BIO SURGEON STRL SZ8 (GLOVE) ×7 IMPLANT
GLOVE BIOGEL PI IND STRL 8 (GLOVE) ×1 IMPLANT
GLOVE BIOGEL PI INDICATOR 8 (GLOVE) ×8
GOWN STRL REUS W/ TWL LRG LVL3 (GOWN DISPOSABLE) ×2 IMPLANT
GOWN STRL REUS W/ TWL XL LVL3 (GOWN DISPOSABLE) ×1 IMPLANT
GOWN STRL REUS W/TWL LRG LVL3 (GOWN DISPOSABLE) ×6
GOWN STRL REUS W/TWL XL LVL3 (GOWN DISPOSABLE) ×2
KIT BASIN OR (CUSTOM PROCEDURE TRAY) ×3 IMPLANT
KIT TURNOVER KIT B (KITS) ×3 IMPLANT
NS IRRIG 1000ML POUR BTL (IV SOLUTION) ×3 IMPLANT
PAD ARMBOARD 7.5X6 YLW CONV (MISCELLANEOUS) ×3 IMPLANT
POUCH RETRIEVAL ECOSAC 10 (ENDOMECHANICALS) ×1 IMPLANT
POUCH RETRIEVAL ECOSAC 10MM (ENDOMECHANICALS) ×2
SCISSORS LAP 5X35 DISP (ENDOMECHANICALS) ×3 IMPLANT
SET IRRIG TUBING LAPAROSCOPIC (IRRIGATION / IRRIGATOR) ×3 IMPLANT
SET TUBE SMOKE EVAC HIGH FLOW (TUBING) ×3 IMPLANT
SLEEVE ENDOPATH XCEL 5M (ENDOMECHANICALS) ×3 IMPLANT
SPECIMEN JAR SMALL (MISCELLANEOUS) ×3 IMPLANT
SUT MNCRL AB 4-0 PS2 18 (SUTURE) ×3 IMPLANT
TOWEL GREEN STERILE (TOWEL DISPOSABLE) ×3 IMPLANT
TOWEL GREEN STERILE FF (TOWEL DISPOSABLE) ×3 IMPLANT
TRAY LAPAROSCOPIC MC (CUSTOM PROCEDURE TRAY) ×3 IMPLANT
TROCAR XCEL BLUNT TIP 100MML (ENDOMECHANICALS) ×3 IMPLANT
TROCAR XCEL NON-BLD 11X100MML (ENDOMECHANICALS) ×3 IMPLANT
TROCAR XCEL NON-BLD 5MMX100MML (ENDOMECHANICALS) ×3 IMPLANT
WARMER LAPAROSCOPE (MISCELLANEOUS) ×3 IMPLANT
WATER STERILE IRR 1000ML POUR (IV SOLUTION) ×3 IMPLANT

## 2020-02-21 NOTE — Anesthesia Procedure Notes (Signed)
Procedure Name: Intubation Performed by: Milford Cage, CRNA Pre-anesthesia Checklist: Patient identified, Emergency Drugs available, Suction available and Patient being monitored Patient Re-evaluated:Patient Re-evaluated prior to induction Oxygen Delivery Method: Circle System Utilized Preoxygenation: Pre-oxygenation with 100% oxygen Induction Type: IV induction Ventilation: Mask ventilation without difficulty Laryngoscope Size: Mac and 3 Grade View: Grade I Tube type: Oral Tube size: 7.0 mm Number of attempts: 1 Airway Equipment and Method: Stylet Placement Confirmation: ETT inserted through vocal cords under direct vision,  positive ETCO2 and breath sounds checked- equal and bilateral Secured at: 22 cm Tube secured with: Tape Dental Injury: Teeth and Oropharynx as per pre-operative assessment

## 2020-02-21 NOTE — Op Note (Signed)
Laparoscopic Cholecystectomy  Procedure Note  Indications: This patient presents with symptomatic gallbladder disease and will undergo laparoscopic cholecystectomy. MRCP showed no obvious obstruction or stone. She presents for laparoscopic cholecystectomy and possible IOC. The procedure has been discussed with the patient. Operative and non operative treatments have been discussed. Risks of surgery include bleeding, infection,  Common bile duct injury,  Injury to the stomach,liver, colon,small intestine, abdominal wall,  Diaphragm,  Major blood vessels,  And the need for an open procedure.  Other risks include worsening of medical problems, death,  DVT and pulmonary embolism, and cardiovascular events.   Medical options have also been discussed. The patient has been informed of long term expectations of surgery and non surgical options,  The patient agrees to proceed.     Pre-operative Diagnosis: Calculus of gallbladder with acute cholecystitis, without mention of obstruction  Post-operative Diagnosis: Same  Surgeon: Turner Daniels MD   Assistants: OR staff  Anesthesia: General endotracheal anesthesia and Local anesthesia 0.25.% bupivacaine, with epinephrine  ASA Class: 2  Procedure Details  The patient was seen again in the Holding Room. The risks, benefits, complications, treatment options, and expected outcomes were discussed with the patient. The possibilities of reaction to medication, pulmonary aspiration, perforation of viscus, bleeding, recurrent infection, finding a normal gallbladder, the need for additional procedures, failure to diagnose a condition, the possible need to convert to an open procedure, and creating a complication requiring transfusion or operation were discussed with the patient. The patient and/or family concurred with the proposed plan, giving informed consent. The site of surgery properly noted/marked. The patient was taken to Operating Room, identified as Andrea [name] and the procedure verified as Laparoscopic Cholecystectomy with Intraoperative Cholangiograms. A Time Out was held and the above information confirmed.  Prior to the induction of general anesthesia, antibiotic prophylaxis was administered. General endotracheal anesthesia was then administered and tolerated well. After the induction, the abdomen was prepped in the usual sterile fashion. The patient was positioned in the supine position with the left arm comfortably tucked, along with some reverse Trendelenburg.  Local anesthetic agent was injected into the skin near the umbilicus and an incision made. The midline fascia was incised and the Hasson technique was used to introduce a 12 mm port under direct vision. It was secured with a figure of eight Vicryl suture placed in the usual fashion. Pneumoperitoneum was then created with CO2 and tolerated well without any adverse changes in the patient's vital signs. Additional trocars were introduced under direct vision with an 11 mm trocar in the epigastrium and 2 5 mm trocars in the right upper quadrant. All skin incisions were infiltrated with a local anesthetic agent before making the incision and placing the trocars.   The gallbladder was identified, the fundus grasped and retracted cephalad. Adhesions were lysed bluntly and with the electrocautery where indicated, taking care not to injure any adjacent organs or viscus. The infundibulum was grasped and retracted laterally, exposing the peritoneum overlying the triangle of Calot. This was then divided and exposed in a blunt fashion. The cystic duct was clearly identified and bluntly dissected circumferentially. The junctions of the gallbladder, cystic duct and common bile duct were clearly identified prior to the division of any linear structure.   The cystic duct was quite small and would not accommodate a cholangiogram catheter. I SPY technology utilized and cystic duct, CBD and liver flouresced.     The cystic duct was then  ligated with surgical clips  on the  patient side and  clipped on the gallbladder side and divided. The cystic artery was identified, dissected free, ligated with clips and divided as well. Posterior cystic artery clipped and divided.  The gallbladder was dissected from the liver bed in retrograde fashion with the electrocautery. The gallbladder was removed. The liver bed was irrigated and inspected. Hemostasis was achieved with the electrocautery. Copious irrigation was utilized and was repeatedly aspirated until clear all particulate matter. Hemostasis was achieved with no signs  Of bleeding or bile leakage.  Pneumoperitoneum was completely reduced after viewing removal of the trocars under direct vision. The wound was thoroughly irrigated and the fascia was then closed with a figure of eight suture; the skin was then closed with 4 0 monocryl and a sterile dressing  Of Dermabond was applied.  Instrument, sponge, and needle counts were correct at closure and at the conclusion of the case.   Findings: Cholecystitis with Cholelithiasis  Estimated Blood Loss: Minimal         Drains: none          Total IV Fluids: Per record         Specimens: Gallbladder           Complications: None; patient tolerated the procedure well.         Disposition: PACU - hemodynamically stable.         Condition: stable

## 2020-02-21 NOTE — Plan of Care (Signed)

## 2020-02-21 NOTE — Discharge Instructions (Signed)
[facility]  Please arrive at least 30 min before your appointment to complete your check in paperwork.  If you are unable to arrive 30 min prior to your appointment time we may have to cancel or reschedule you. LAPAROSCOPIC SURGERY: POST OP INSTRUCTIONS Always review your discharge instruction sheet given to you by the facility where your surgery was performed. IF YOU HAVE DISABILITY OR FAMILY LEAVE FORMS, YOU MUST BRING THEM TO THE OFFICE FOR PROCESSING.   DO NOT GIVE THEM TO YOUR DOCTOR.  PAIN CONTROL  1. First take acetaminophen (Tylenol) AND/or ibuprofen (Advil) to control your pain after surgery.  Follow directions on package.  Taking acetaminophen (Tylenol) and/or ibuprofen (Advil) regularly after surgery will help to control your pain and lower the amount of prescription pain medication you may need.  You should not take more than 4,000 mg (4 grams) of acetaminophen (Tylenol) in 24 hours.  You should not take ibuprofen (Advil), aleve, motrin, naprosyn or other NSAIDS if you have a history of stomach ulcers or chronic kidney disease.  2. A prescription for pain medication may be given to you upon discharge.  Take your pain medication as prescribed, if you still have uncontrolled pain after taking acetaminophen (Tylenol) or ibuprofen (Advil). 3. Use ice packs to help control pain. 4. If you need a refill on your pain medication, please contact your pharmacy.  They will contact our office to request authorization. Prescriptions will not be filled after 5pm or on week-ends.  HOME MEDICATIONS 5. Take your usually prescribed medications unless otherwise directed.  DIET 6. You should follow a light diet the first few days after arrival home.  Be sure to include lots of fluids daily. Avoid fatty, fried foods.   CONSTIPATION 7. It is common to experience some constipation after surgery and if you are taking pain medication.  Increasing fluid intake and taking a stool  softener (such as Colace) will usually help or prevent this problem from occurring.  A mild laxative (Milk of Magnesia or Miralax) should be taken according to package instructions if there are no bowel movements after 48 hours.  WOUND/INCISION CARE 8. Most patients will experience some swelling and bruising in the area of the incisions.  Ice packs will help.  Swelling and bruising can take several days to resolve.  9. Unless discharge instructions indicate otherwise, follow guidelines below  a. STERI-STRIPS - you may remove your outer bandages 48 hours after surgery, and you may shower at that time.  You have steri-strips (small skin tapes) in place directly over the incision.  These strips should be left on the skin for 7-10 days.   b. DERMABOND/SKIN GLUE - you may shower in 24 hours.  The glue will flake off over the next 2-3 weeks. 10. Any sutures or staples will be removed at the office during your follow-up visit.  ACTIVITIES 11. You may resume regular (light) daily activities beginning the next day--such as daily self-care, walking, climbing stairs--gradually increasing activities as tolerated.  You may have sexual intercourse when it is comfortable.  Refrain from any heavy lifting or straining until approved by your doctor. a. You may drive when you are no longer taking prescription pain medication, you can comfortably wear a seatbelt, and you can safely maneuver your car and apply brakes.  FOLLOW-UP 12. You should see your doctor in the office for a follow-up appointment approximately 2-3 weeks after your surgery.  You should have been given your post-op/follow-up appointment when   your surgery was scheduled.  If you did not receive a post-op/follow-up appointment, make sure that you call for this appointment within a day or two after you arrive home to insure a convenient appointment time.   WHEN TO CALL YOUR DOCTOR: 1. Fever over 101.0 2. Inability to urinate 3. Continued bleeding from  incision. 4. Increased pain, redness, or drainage from the incision. 5. Increasing abdominal pain  The clinic staff is available to answer your questions during regular business hours.  Please don't hesitate to call and ask to speak to one of the nurses for clinical concerns.  If you have a medical emergency, go to the nearest emergency room or call 911.  A surgeon from [facility] is always on call at the hospital. [address], [address], [place], [place]  [address] ? [address] [address], [place], [place]   [address] [phone] ? [phone] ? FAX [phone]    Gallbladder Eating Plan If you have a gallbladder condition, you may have trouble digesting fats. Eating a low-fat diet can help reduce your symptoms, and may be helpful before and after having surgery to remove your gallbladder (cholecystectomy). Your health care provider may recommend that you work with a diet and nutrition specialist (dietitian) to help you reduce the amount of fat in your diet. What are tips for following this plan? General guidelines  Limit your fat intake to less than 30% of your total daily calories. If you eat around 1,800 calories each day, this is less than 60 grams (g) of fat per day.  Fat is an important part of a healthy diet. Eating a low-fat diet can make it hard to maintain a healthy body weight. Ask your dietitian how much fat, calories, and other nutrients you need each day.  Eat small, frequent meals throughout the day instead of three large meals.  Drink at least 8-10 cups of fluid a day. Drink enough fluid to keep your urine clear or pale yellow.  Limit alcohol intake to no more than 1 drink a day for nonpregnant women and 2 drinks a day for men. One drink equals 12 oz of beer, 5 oz of wine, or 1 oz of hard liquor. Reading food labels  Check Nutrition Facts on food labels for the amount of fat per serving. Choose foods with less than 3 grams of fat per  serving. Shopping  Choose nonfat and low-fat healthy foods. Look for the words "nonfat," "low fat," or "fat free."  Avoid buying processed or prepackaged foods. Cooking  Cook using low-fat methods, such as baking, broiling, grilling, or boiling.  Cook with small amounts of healthy fats, such as olive oil, grapeseed oil, canola oil, or sunflower oil. What foods are recommended?   All fresh, frozen, or canned fruits and vegetables.  Whole grains.  Low-fat or non-fat (skim) milk and yogurt.  Lean meat, skinless poultry, fish, eggs, and beans.  Low-fat protein supplement powders or drinks.  Spices and herbs. What foods are not recommended?  High-fat foods. These include baked goods, fast food, fatty cuts of meat, ice cream, french toast, sweet rolls, pizza, cheese bread, foods covered with butter, creamy sauces, or cheese.  Fried foods. These include french fries, tempura, battered fish, breaded chicken, fried breads, and sweets.  Foods with strong odors.  Foods that cause bloating and gas. Summary  A low-fat diet can be helpful if you have a gallbladder condition, or before and after gallbladder surgery.  Limit your fat intake to   less than 30% of your total daily calories. This is about 60 g of fat if you eat 1,800 calories each day.  Eat small, frequent meals throughout the day instead of three large meals. This information is not intended to replace advice given to you by your health care provider. Make sure you discuss any questions you have with your health care provider. Document Revised: 12/03/2018 Document Reviewed: 09/19/2016 Elsevier Patient Education  2020 [employer]  

## 2020-02-21 NOTE — Progress Notes (Signed)
Pt received back from PACU s/p Lap chole with sites intact, no drainage.  BP now 128/93 at present. Dr. Broadus John asked about a BP med, ordered.  Will continue to monitor.

## 2020-02-21 NOTE — Progress Notes (Signed)
BP 146/102, Norvasc given, will continue to monitor.

## 2020-02-21 NOTE — Anesthesia Preprocedure Evaluation (Addendum)
Anesthesia Evaluation  Patient identified by MRN, date of birth, ID band Patient awake    Reviewed: Allergy & Precautions, H&P , NPO status , Patient's Chart, lab work & pertinent test results  Airway Mallampati: II  TM Distance: >3 FB Neck ROM: Full    Dental no notable dental hx. (+) Teeth Intact, Dental Advisory Given   Pulmonary neg pulmonary ROS,    Pulmonary exam normal breath sounds clear to auscultation       Cardiovascular hypertension, Pt. on medications  Rhythm:Regular Rate:Normal     Neuro/Psych negative neurological ROS  negative psych ROS   GI/Hepatic negative GI ROS, Neg liver ROS,   Endo/Other  Morbid obesity  Renal/GU negative Renal ROS  negative genitourinary   Musculoskeletal   Abdominal   Peds  Hematology  (+) Blood dyscrasia, anemia ,   Anesthesia Other Findings   Reproductive/Obstetrics negative OB ROS                            Anesthesia Physical Anesthesia Plan  ASA: III  Anesthesia Plan: General   Post-op Pain Management:    Induction: Intravenous  PONV Risk Score and Plan: 4 or greater and Ondansetron, Dexamethasone and Midazolam  Airway Management Planned: Oral ETT  Additional Equipment:   Intra-op Plan:   Post-operative Plan: Extubation in OR  Informed Consent: I have reviewed the patients History and Physical, chart, labs and discussed the procedure including the risks, benefits and alternatives for the proposed anesthesia with the patient or authorized representative who has indicated his/her understanding and acceptance.     Dental advisory given  Plan Discussed with: CRNA  Anesthesia Plan Comments:         Anesthesia Quick Evaluation

## 2020-02-21 NOTE — Interval H&P Note (Signed)
History and Physical Interval Note:  02/21/2020 10:42 AM  Andrea [name]  has presented today for surgery, with the diagnosis of GALL STONES.  The various methods of treatment have been discussed with the patient and family. After consideration of risks, benefits and other options for treatment, the patient has consented to  Procedure(s): LAPAROSCOPIC CHOLECYSTECTOMY (N/A) as a surgical intervention.  The patient's history has been reviewed, patient examined, no change in status, stable for surgery.  I have reviewed the patient's chart and labs.  Questions were answered to the patient's satisfaction.     Turner Daniels MD

## 2020-02-21 NOTE — Progress Notes (Signed)
Subjective: CC: Patient reports that her abdominal pain has nearly resolved this morning except with palpation. Was on FLD this am and had applejuice around 630 am. No increased abdominal pain or n/v with this. Mobilizing without difficulty.   Objective: Vital signs in last 24 hours: Temp:  [97.6 F (36.4 C)-98.2 F (36.8 C)] 98.1 F (36.7 C) (06/28 0414) Pulse Rate:  [72-87] 77 (06/28 0414) Resp:  [16-19] 19 (06/28 0414) BP: (120-146)/(85-109) 144/109 (06/28 0414) SpO2:  [97 %-100 %] 97 % (06/28 0414)    Intake/Output from previous day: 06/27 0701 - 06/28 0700 In: 570 [P.O.:420; I.V.:150] Out: -  Intake/Output this shift: No intake/output data recorded.  PE: Gen:  Alert, NAD, pleasant Pulm: Normal rate and effort  Abd: Soft, ND, tenderness of the epigastrium without peritonitis. +BS. Prior abdominal scars are well healed.  Ext:  No LE edema  Psych: A&Ox3  Skin: no rashes noted, warm and dry   Lab Results:  Recent Labs    02/20/20 0301 02/21/20 0139  WBC 5.2 4.5  HGB 12.8 11.9*  HCT 36.5 34.8*  PLT 157 138*   BMET Recent Labs    02/20/20 0301 02/21/20 0139  NA 138 142  K 3.1* 3.0*  CL 106 111  CO2 22 22  GLUCOSE 92 86  BUN 9 10  CREATININE 1.04* 0.87  CALCIUM 8.8* 8.5*   PT/INR No results for input(s): LABPROT, INR in the last 72 hours. CMP     Component Value Date/Time   NA 142 02/21/2020 0139   K 3.0 (L) 02/21/2020 0139   CL 111 02/21/2020 0139   CO2 22 02/21/2020 0139   GLUCOSE 86 02/21/2020 0139   BUN 10 02/21/2020 0139   CREATININE 0.87 02/21/2020 0139   CALCIUM 8.5 (L) 02/21/2020 0139   PROT 6.0 (L) 02/21/2020 0139   ALBUMIN 3.3 (L) 02/21/2020 0139   AST 109 (H) 02/21/2020 0139   ALT 180 (H) 02/21/2020 0139   ALKPHOS 121 02/21/2020 0139   BILITOT 1.7 (H) 02/21/2020 0139   GFRNONAA >60 02/21/2020 0139   GFRAA >60 02/21/2020 0139   Lipase     Component Value Date/Time   LIPASE 36 02/19/2020 1005        Studies/Results: US Abdomen Complete  Result Date: 02/19/2020 CLINICAL DATA:  Abdominal and epigastric pain since 0845 hours today, history hypertension and breast cancer EXAM: ABDOMEN ULTRASOUND COMPLETE COMPARISON:  None FINDINGS: Gallbladder: Multiple tiny shadowing calculi within gallbladder, largest measurable calculus 5 mm diameter. Gallbladder wall upper normal thickness. No sonographic Murphy sign or pericholecystic fluid. Common bile duct: Diameter: 8 mm, minimally prominent. Liver: Normal echogenicity without focal mass. Minimal central intrahepatic biliary dilatation. Portal vein is patent on color Doppler imaging with normal direction of blood flow towards the liver. IVC: Normal appearance Pancreas: Portions of tail obscured by bowel gas. Visualized portions normal appearance. Spleen: Normal appearance, 6.9 cm length Right Kidney: Length: 9.8 cm. Normal morphology without mass or hydronephrosis. Left Kidney: Length: 9.8 cm. Normal morphology without mass or hydronephrosis. Abdominal aorta: Normal caliber proximally, mid to distal portions obscured by bowel gas Other findings: No free fluid IMPRESSION: Cholelithiasis with upper normal gallbladder wall thickness. Mildly prominent CBD with minimal central intrahepatic biliary dilatation, recommend correlation with LFTs. Electronically Signed   By: Lavonia Dana M.D.   On: 02/19/2020 13:16   CT ABDOMEN PELVIS W CONTRAST  Result Date: 02/19/2020 CLINICAL DATA:  Cholecystitis. EXAM: CT ABDOMEN AND PELVIS WITH CONTRAST TECHNIQUE:  Multidetector CT imaging of the abdomen and pelvis was performed using the standard protocol following bolus administration of intravenous contrast. CONTRAST:  120mL OMNIPAQUE IOHEXOL 300 MG/ML  SOLN COMPARISON:  None recent. FINDINGS: Lower chest: The lung bases are clear. The heart is enlarged. Hepatobiliary: The patient's prior CT and MRI were not available for comparison. There is a 2.2 cm hypoattenuating lesion in  hepatic segment 6 (axial series 3, image 24). This presumably represents the previously documented hepatic hemangioma. Cholelithiasis without acute inflammation.The common bile duct is dilated measuring approximately 9 mm distally. Pancreas: Normal contours without ductal dilatation. No peripancreatic fluid collection. Spleen: The spleen is borderline enlarged measuring approximately 12 cm. Adrenals/Urinary Tract: --Adrenal glands: Unremarkable. --Right kidney/ureter: No hydronephrosis or radiopaque kidney stones. --Left kidney/ureter: No hydronephrosis or radiopaque kidney stones. --Urinary bladder: The bladder is underdistended. There does appear to be some diffuse bladder wall thickening. Stomach/Bowel: --Stomach/Duodenum: There are postsurgical changes of the stomach. --Small bowel: Unremarkable. --Colon: Unremarkable. --Appendix: Normal. Vascular/Lymphatic: Atherosclerotic calcification is present within the non-aneurysmal abdominal aorta, without hemodynamically significant stenosis. --No retroperitoneal lymphadenopathy. --No mesenteric lymphadenopathy. --No pelvic or inguinal lymphadenopathy. Reproductive: There is apparent diffuse thickening of the endometrium. Other: No ascites or free air. There is a fat containing umbilical hernia. Musculoskeletal. No acute displaced fractures. IMPRESSION: 1. Cholelithiasis without acute inflammation. 2. Dilated common bile duct measuring up to 9 mm distally. Correlation with laboratory studies is recommended. If there is concern for choledocholithiasis, follow-up with MRCP or ERCP is recommended. 3. Apparent diffuse thickening of the endometrium. Recommend further evaluation with nonemergent outpatient pelvic ultrasound. 4. Bladder wall thickening. Correlation with urinalysis is recommended. 5. Cardiomegaly. Aortic Atherosclerosis (ICD10-I70.0). Electronically Signed   By: Constance Holster M.D.   On: 02/19/2020 17:09   DG Abd 2 Views  Result Date:  02/19/2020 CLINICAL DATA:  Upper abdominal pain since 8:45 a.m. EXAM: ABDOMEN - 2 VIEW COMPARISON:  None. FINDINGS: Normal bowel gas pattern without free peritoneal air. Bilateral pelvic phleboliths. Mild scoliosis. Lumbar and lower thoracic spine degenerative changes. IMPRESSION: No acute abnormality. Electronically Signed   By: Claudie Revering M.D.   On: 02/19/2020 12:13    Anti-infectives: Anti-infectives (From admission, onward)   None       Assessment/Plan Hypertension Anemia Hx gastric sleeve Hx breast cancers/plumpectomy with chemo and radiation completed in March Endometrial thickening on CT Hypokalemia   Symptomatic Cholelithiasis with likely early Cholecystitis  - On CT CBD is dilated CBD and t bili elevated on labs. MRCP was ordered on 6/27 and results are pending. T bili and LFT's improving today.  - If MRCP is positive for CBD stone, will plan for GI consult. If MRCP is negative, will plan for lap chole later today - Start IV Rocephin - Mobilize - Pulm toilet    FEN - NPO VTE - Scds, Lovenox ID - None currently. Start Rocephin.    LOS: 2 days    Jillyn Ledger , [facility] 02/21/2020, 8:00 AM Please see Amion for pager number during day hours 7:00am-4:30pm

## 2020-02-21 NOTE — Anesthesia Postprocedure Evaluation (Signed)
Anesthesia Post Note  Patient: Teja Elling  Procedure(s) Performed: LAPAROSCOPIC CHOLECYSTECTOMY (N/A Abdomen)     Patient location during evaluation: PACU Anesthesia Type: General Level of consciousness: awake and alert Pain management: pain level controlled Vital Signs Assessment: post-procedure vital signs reviewed and stable Respiratory status: spontaneous breathing, nonlabored ventilation and respiratory function stable Cardiovascular status: blood pressure returned to baseline and stable Postop Assessment: no apparent nausea or vomiting Anesthetic complications: no   No complications documented.  Last Vitals:  Vitals:   02/21/20 1415 02/21/20 1442  BP: (!) 138/91 (!) 129/93  Pulse: 91 94  Resp: 17 18  Temp: 36.9 C 36.8 C  SpO2: 93% 98%    Last Pain:  Vitals:   02/21/20 1442  TempSrc: Oral  PainSc:                  Horst Ostermiller,W. EDMOND

## 2020-02-21 NOTE — Progress Notes (Signed)
Recheck on BP is 124/93.Pt has been up and urinated.

## 2020-02-21 NOTE — Progress Notes (Signed)
PROGRESS NOTE    Anabela Crayton  XBL:[id number] DOB: 1971-06-24 DOA: 02/19/2020 PCP: Patient, No Pcp Per  Brief Narrative:  Donelle Hise is a 49 y.o. female with medical history significant of hypertension, anemia, s/p gastric sleeve, breast cancer s/p lumpectomy with chemo and radiation completed in March, and obesity who presented with upper abdominal pain starting 6/26 morning around 8:45 AM.   Reported having nausea with nonbloody emesis.  Work-up in the ED noted potassium 3.2 and total bilirubin 1.6.   Ultrasound of the abdomen revealed cholelithiasis with gallbladder thickening at the upper limit of normal with mildly prominent common bile duct and intrahepatic ducts   Assessment & Plan:  Symptomatic cholelithiasis General surgery consulting  -Labs with improving bilirubin and LFTs -MRCP done, read pending -Plan for lap chole, CMP in a.m.  History of breast cancer: Patient initially diagnosed in 08/2018 status post lumpectomy with chemotherapy and radiation. -Continue outpatient follow-up with hematology oncology at [facility]  S/p gastric sleeve  DVT prophylaxis: lovenox Code Status: full Family Communication:  Discussed patient in detail Disposition Plan: Status is: Inpatient  Remains inpatient appropriate because:Ongoing diagnostic testing needed not appropriate for outpatient work up   Dispo: The patient is from: Home              Anticipated d/c is to: Home              Anticipated d/c date is: 1-2 days              Patient currently is not medically stable to d/c.  Consultants:   CCS   Procedures:   Antimicrobials:    Subjective: -Feels better, denies any abdominal pain, no nausea or vomiting Objective: Vitals:   02/21/20 0414 02/21/20 0414 02/21/20 0943 02/21/20 1010  BP: (!) 141/104 (!) 144/109 (!) 140/99   Pulse: 78 77 90   Resp: 19 19 18    Temp: 98.1 F (36.7 C) 98.1 F (36.7 C) 98.2 F (36.8 C)   TempSrc: Oral Oral Oral   SpO2: 100% 97%  100%   Weight:    87.5 kg  Height:    5' 0.98" (1.549 m)    Intake/Output Summary (Last 24 hours) at 02/21/2020 1131 Last data filed at 02/21/2020 1056 Gross per 24 hour  Intake 790 ml  Output --  Net 790 ml   Filed Weights   02/19/20 1003 02/21/20 1010  Weight: 87.5 kg 87.5 kg    Examination:  General exam: Obese pleasant female sitting up in bed, AAOx3, no distress CVS: S1-S2, regular rate rhythm Lungs: Clear bilaterally Abdomen: Soft, nontender, bowel sounds present Extremities: No edema Skin: No rashes on exposed skin Psychiatry: Mood & affect appropriate.     Data Reviewed:   CBC: Recent Labs  Lab 02/19/20 1005 02/20/20 0301 02/21/20 0139  WBC 4.4 5.2 4.5  HGB 13.4 12.8 11.9*  HCT 39.1 36.5 34.8*  MCV 90.7 89.0 90.4  PLT 170 157 300*   Basic Metabolic Panel: Recent Labs  Lab 02/19/20 1005 02/20/20 0301 02/21/20 0139  NA 141 138 142  K 3.2* 3.1* 3.0*  CL 106 106 111  CO2 25 22 22   GLUCOSE 102* 92 86  BUN 13 9 10   CREATININE 0.87 1.04* 0.87  CALCIUM 8.9 8.8* 8.5*   GFR: Estimated Creatinine Clearance: 77.8 mL/min (by C-G formula based on SCr of 0.87 mg/dL). Liver Function Tests: Recent Labs  Lab 02/19/20 1005 02/20/20 0301 02/21/20 0139  AST 39 385* 109*  ALT 18 323*  180*  ALKPHOS 96 150* 121  BILITOT 1.6* 2.2* 1.7*  PROT 6.8 6.5 6.0*  ALBUMIN 4.1 3.7 3.3*   Recent Labs  Lab 02/19/20 1005  LIPASE 36   No results for input(s): AMMONIA in the last 168 hours. Coagulation Profile: No results for input(s): INR, PROTIME in the last 168 hours. Cardiac Enzymes: No results for input(s): CKTOTAL, CKMB, CKMBINDEX, TROPONINI in the last 168 hours. BNP (last 3 results) No results for input(s): PROBNP in the last 8760 hours. HbA1C: No results for input(s): HGBA1C in the last 72 hours. CBG: No results for input(s): GLUCAP in the last 168 hours. Lipid Profile: No results for input(s): CHOL, HDL, LDLCALC, TRIG, CHOLHDL, LDLDIRECT in the last  72 hours. Thyroid Function Tests: No results for input(s): TSH, T4TOTAL, FREET4, T3FREE, THYROIDAB in the last 72 hours. Anemia Panel: No results for input(s): VITAMINB12, FOLATE, FERRITIN, TIBC, IRON, RETICCTPCT in the last 72 hours. Urine analysis:    Component Value Date/Time   COLORURINE YELLOW 02/19/2020 1015   APPEARANCEUR CLEAR 02/19/2020 1015   LABSPEC 1.013 02/19/2020 1015   [place] 7.0 02/19/2020 1015   GLUCOSEU NEGATIVE 02/19/2020 1015   HGBUR NEGATIVE 02/19/2020 1015   BILIRUBINUR NEGATIVE 02/19/2020 1015   KETONESUR NEGATIVE 02/19/2020 1015   PROTEINUR NEGATIVE 02/19/2020 1015   NITRITE NEGATIVE 02/19/2020 1015   LEUKOCYTESUR NEGATIVE 02/19/2020 1015   Sepsis Labs: @LABRCNTIP (procalcitonin:4,lacticidven:4)  ) Recent Results (from the past 240 hour(s))  SARS Coronavirus 2 by RT PCR (hospital order, performed in [place] hospital lab) Nasopharyngeal Nasopharyngeal Swab     Status: None   Collection Time: 02/19/20  3:16 PM   Specimen: Nasopharyngeal Swab  Result Value Ref Range Status   SARS Coronavirus 2 NEGATIVE NEGATIVE Final    Comment: (NOTE) SARS-CoV-2 target nucleic acids are NOT DETECTED.  The SARS-CoV-2 RNA is generally detectable in upper and lower respiratory specimens during the acute phase of infection. The lowest concentration of SARS-CoV-2 viral copies this assay can detect is 250 copies / mL. A negative result does not preclude SARS-CoV-2 infection and should not be used as the sole basis for treatment or other patient management decisions.  A negative result may occur with improper specimen collection / handling, submission of specimen other than nasopharyngeal swab, presence of viral mutation(s) within the areas targeted by this assay, and inadequate number of viral copies (<250 copies / mL). A negative result must be combined with clinical observations, patient history, and epidemiological information.  Fact Sheet for Patients:    StrictlyIdeas.no  Fact Sheet for Healthcare Providers: BankingDealers.co.za  This test is not yet approved or  cleared by the [place] FDA and has been authorized for detection and/or diagnosis of SARS-CoV-2 by FDA under an Emergency Use Authorization (EUA).  This EUA will remain in effect (meaning this test can be used) for the duration of the COVID-19 declaration under Section 564(b)(1) of the Act, 21 U.S.C. section 360bbb-3(b)(1), unless the authorization is terminated or revoked sooner.  Performed at [facility], [place] [address], [place], [place] [address]          Radiology Studies: US Abdomen Complete  Result Date: 02/19/2020 CLINICAL DATA:  Abdominal and epigastric pain since 0845 hours today, history hypertension and breast cancer EXAM: ABDOMEN ULTRASOUND COMPLETE COMPARISON:  None FINDINGS: Gallbladder: Multiple tiny shadowing calculi within gallbladder, largest measurable calculus 5 mm diameter. Gallbladder wall upper normal thickness. No sonographic Murphy sign or pericholecystic fluid. Common bile duct: Diameter: 8 mm, minimally prominent.  Liver: Normal echogenicity without focal mass. Minimal central intrahepatic biliary dilatation. Portal vein is patent on color Doppler imaging with normal direction of blood flow towards the liver. IVC: Normal appearance Pancreas: Portions of tail obscured by bowel gas. Visualized portions normal appearance. Spleen: Normal appearance, 6.9 cm length Right Kidney: Length: 9.8 cm. Normal morphology without mass or hydronephrosis. Left Kidney: Length: 9.8 cm. Normal morphology without mass or hydronephrosis. Abdominal aorta: Normal caliber proximally, mid to distal portions obscured by bowel gas Other findings: No free fluid IMPRESSION: Cholelithiasis with upper normal gallbladder wall thickness. Mildly prominent CBD with minimal central intrahepatic biliary dilatation, recommend  correlation with LFTs. Electronically Signed   By: Lavonia Dana M.D.   On: 02/19/2020 13:16   CT ABDOMEN PELVIS W CONTRAST  Result Date: 02/19/2020 CLINICAL DATA:  Cholecystitis. EXAM: CT ABDOMEN AND PELVIS WITH CONTRAST TECHNIQUE: Multidetector CT imaging of the abdomen and pelvis was performed using the standard protocol following bolus administration of intravenous contrast. CONTRAST:  127mL OMNIPAQUE IOHEXOL 300 MG/ML  SOLN COMPARISON:  None recent. FINDINGS: Lower chest: The lung bases are clear. The heart is enlarged. Hepatobiliary: The patient's prior CT and MRI were not available for comparison. There is a 2.2 cm hypoattenuating lesion in hepatic segment 6 (axial series 3, image 24). This presumably represents the previously documented hepatic hemangioma. Cholelithiasis without acute inflammation.The common bile duct is dilated measuring approximately 9 mm distally. Pancreas: Normal contours without ductal dilatation. No peripancreatic fluid collection. Spleen: The spleen is borderline enlarged measuring approximately 12 cm. Adrenals/Urinary Tract: --Adrenal glands: Unremarkable. --Right kidney/ureter: No hydronephrosis or radiopaque kidney stones. --Left kidney/ureter: No hydronephrosis or radiopaque kidney stones. --Urinary bladder: The bladder is underdistended. There does appear to be some diffuse bladder wall thickening. Stomach/Bowel: --Stomach/Duodenum: There are postsurgical changes of the stomach. --Small bowel: Unremarkable. --Colon: Unremarkable. --Appendix: Normal. Vascular/Lymphatic: Atherosclerotic calcification is present within the non-aneurysmal abdominal aorta, without hemodynamically significant stenosis. --No retroperitoneal lymphadenopathy. --No mesenteric lymphadenopathy. --No pelvic or inguinal lymphadenopathy. Reproductive: There is apparent diffuse thickening of the endometrium. Other: No ascites or free air. There is a fat containing umbilical hernia. Musculoskeletal. No acute  displaced fractures. IMPRESSION: 1. Cholelithiasis without acute inflammation. 2. Dilated common bile duct measuring up to 9 mm distally. Correlation with laboratory studies is recommended. If there is concern for choledocholithiasis, follow-up with MRCP or ERCP is recommended. 3. Apparent diffuse thickening of the endometrium. Recommend further evaluation with nonemergent outpatient pelvic ultrasound. 4. Bladder wall thickening. Correlation with urinalysis is recommended. 5. Cardiomegaly. Aortic Atherosclerosis (ICD10-I70.0). Electronically Signed   By: Constance Holster M.D.   On: 02/19/2020 17:09   DG Abd 2 Views  Result Date: 02/19/2020 CLINICAL DATA:  Upper abdominal pain since 8:45 a.m. EXAM: ABDOMEN - 2 VIEW COMPARISON:  None. FINDINGS: Normal bowel gas pattern without free peritoneal air. Bilateral pelvic phleboliths. Mild scoliosis. Lumbar and lower thoracic spine degenerative changes. IMPRESSION: No acute abnormality. Electronically Signed   By: Claudie Revering M.D.   On: 02/19/2020 12:13        Scheduled Meds: . [MAR Hold] potassium chloride  40 mEq Oral Once  . [MAR Hold] sodium chloride flush  3 mL Intravenous Q12H   Continuous Infusions: . [MAR Hold] cefTRIAXone (ROCEPHIN)  IV    . lactated ringers 10 mL/hr at 02/21/20 1024     LOS: 2 days    Time spent: 36min  Domenic Polite, MD [facility]  02/21/2020, 11:31 AM

## 2020-02-21 NOTE — Transfer of Care (Signed)
Immediate Anesthesia Transfer of Care Note  Patient: Andrea [name]  Procedure(s) Performed: LAPAROSCOPIC CHOLECYSTECTOMY (N/A Abdomen)  Patient Location: PACU  Anesthesia Type:General  Level of Consciousness: drowsy  Airway & Oxygen Therapy: Patient Spontanous Breathing  Post-op Assessment: Report given to RN and Post -op Vital signs reviewed and stable  Post vital signs: Reviewed and stable  Last Vitals:  Vitals Value Taken Time  BP 164/93 02/21/20 1215  Temp 36.4 C 02/21/20 1215  Pulse 59 02/21/20 1216  Resp 21 02/21/20 1216  SpO2 98 % 02/21/20 1216  Vitals shown include unvalidated device data.  Last Pain:  Vitals:   02/21/20 0943  TempSrc: Oral  PainSc:          Complications: No complications documented.

## 2020-02-22 ENCOUNTER — Encounter (HOSPITAL_COMMUNITY): Payer: Self-pay | Admitting: Surgery

## 2020-02-22 LAB — CBC
HCT: 36 % (ref 36.0–46.0)
Hemoglobin: 12.3 g/dL (ref 12.0–15.0)
MCH: 30.8 pg (ref 26.0–34.0)
MCHC: 34.2 g/dL (ref 30.0–36.0)
MCV: 90.2 fL (ref 80.0–100.0)
Platelets: 162 10*3/uL (ref 150–400)
RBC: 3.99 MIL/uL (ref 3.87–5.11)
RDW: 12.2 % (ref 11.5–15.5)
WBC: 7.3 10*3/uL (ref 4.0–10.5)
nRBC: 0 % (ref 0.0–0.2)

## 2020-02-22 LAB — COMPREHENSIVE METABOLIC PANEL
ALT: 144 U/L — ABNORMAL HIGH (ref 0–44)
AST: 72 U/L — ABNORMAL HIGH (ref 15–41)
Albumin: 3.5 g/dL (ref 3.5–5.0)
Alkaline Phosphatase: 111 U/L (ref 38–126)
Anion gap: 11 (ref 5–15)
BUN: 13 mg/dL (ref 6–20)
CO2: 22 mmol/L (ref 22–32)
Calcium: 8.6 mg/dL — ABNORMAL LOW (ref 8.9–10.3)
Chloride: 108 mmol/L (ref 98–111)
Creatinine, Ser: 1.19 mg/dL — ABNORMAL HIGH (ref 0.44–1.00)
GFR calc Af Amer: >60 mL/min (ref >60)
GFR calc non Af Amer: 53 mL/min — ABNORMAL LOW (ref >60)
Glucose, Bld: 124 mg/dL — ABNORMAL HIGH (ref 70–99)
Potassium: 3.3 mmol/L — ABNORMAL LOW (ref 3.5–5.1)
Sodium: 141 mmol/L (ref 135–145)
Total Bilirubin: 0.8 mg/dL (ref 0.3–1.2)
Total Protein: 6.5 g/dL (ref 6.5–8.1)

## 2020-02-22 LAB — SURGICAL PATHOLOGY

## 2020-02-22 MED ORDER — OXYCODONE HCL 5 MG PO TABS: 5.0000 mg | ORAL_TABLET | Freq: Four times a day (QID) | ORAL | 0 refills | Status: AC | PRN

## 2020-02-22 MED ORDER — POTASSIUM CHLORIDE CRYS ER 20 MEQ PO TBCR
40.0000 meq | EXTENDED_RELEASE_TABLET | Freq: Once | ORAL | Status: AC
  Administered 2020-02-22: 40 meq via ORAL
  Filled 2020-02-22: qty 2

## 2020-02-22 MED ORDER — FAMOTIDINE 20 MG PO TABS
20.0000 mg | ORAL_TABLET | Freq: Every day | ORAL | Status: DC
  Administered 2020-02-22: 20 mg via ORAL
  Filled 2020-02-22 (×2): qty 1

## 2020-02-22 MED ORDER — ACETAMINOPHEN 500 MG PO TABS
1000.0000 mg | ORAL_TABLET | Freq: Four times a day (QID) | ORAL | Status: DC
  Administered 2020-02-22 – 2020-02-23 (×4): 1000 mg via ORAL
  Filled 2020-02-22 (×5): qty 2

## 2020-02-22 NOTE — Progress Notes (Signed)
PROGRESS NOTE    Andrea [name]  RWE:[id number] DOB: 29-Mar-1971 DOA: 02/19/2020 PCP: Patient, No Pcp Per  Brief Narrative:  Andrea [name] is a 49 y.o. female with medical history significant of hypertension, anemia, s/p gastric sleeve, breast cancer s/p lumpectomy with chemo and radiation completed in March, and obesity who presented with upper abdominal pain starting 6/26 morning around 8:45 AM.   Reported having nausea with nonbloody emesis.    Ultrasound of the abdomen revealed cholelithiasis with gallbladder thickening. -Underwent lap chole 6/20   Assessment & Plan:  Symptomatic cholelithiasis General surgery consulting  -Underwent lap chole yesterday, started diet, vomited this morning -Ambulate, advance diet as tolerated, supportive care -Discharge planning  History of breast cancer: Patient initially diagnosed in 08/2018 status post lumpectomy with chemotherapy and radiation. -Continue outpatient follow-up with hematology oncology at [facility]  S/p gastric sleeve  DVT prophylaxis: lovenox Code Status: full Family Communication:  Discussed patient in detail Disposition Plan: Status is: Inpatient  Remains inpatient appropriate because:pending improvement in nausea/improved Po intake, surgical clearance   Dispo: The patient is from: Home              Anticipated d/c is to: Home              Anticipated d/c date is:6/30              Patient currently is not medically stable to d/c.  Consultants:   CCS   Procedures:   Antimicrobials:    Subjective: -Vomited this morning, feels dizzy and nauseated Objective: Vitals:   02/22/20 0236 02/22/20 0545 02/22/20 1046 02/22/20 1331  BP: 120/81 119/85 128/77 131/84  Pulse: 84 82 92 89  Resp: 16 16 20 16   Temp: 99.1 F (37.3 C) 98.2 F (36.8 C) 98.3 F (36.8 C) 99 F (37.2 C)  TempSrc: Oral  Oral Oral  SpO2: 98% 99% 98% 98%  Weight:      Height:        Intake/Output Summary (Last 24 hours) at 02/22/2020  1411 Last data filed at 02/22/2020 0900 Gross per 24 hour  Intake 480 ml  Output 500 ml  Net -20 ml   Filed Weights   02/19/20 1003 02/21/20 1010  Weight: 87.5 kg 87.5 kg    Examination:  General exam: Obese pleasant female sitting up in bed, AAOx3, no distress CVS: S1-S2, regular rate rhythm Lungs are clear anteriorly Abdomen is soft, mildly distended, port sites unremarkable with dressing, tender as expected, bowel sounds are diminished Extremities: No edema Skin: No rashes on exposed skin Psychiatry: Mood & affect appropriate.     Data Reviewed:   CBC: Recent Labs  Lab 02/19/20 1005 02/20/20 0301 02/21/20 0139 02/22/20 0158  WBC 4.4 5.2 4.5 7.3  HGB 13.4 12.8 11.9* 12.3  HCT 39.1 36.5 34.8* 36.0  MCV 90.7 89.0 90.4 90.2  PLT 170 157 138* 619   Basic Metabolic Panel: Recent Labs  Lab 02/19/20 1005 02/20/20 0301 02/21/20 0139 02/22/20 0158  NA 141 138 142 141  K 3.2* 3.1* 3.0* 3.3*  CL 106 106 111 108  CO2 25 22 22 22   GLUCOSE 102* 92 86 124*  BUN 13 9 10 13   CREATININE 0.87 1.04* 0.87 1.19*  CALCIUM 8.9 8.8* 8.5* 8.6*   GFR: Estimated Creatinine Clearance: 56.9 mL/min (A) (by C-G formula based on SCr of 1.19 mg/dL (H)). Liver Function Tests: Recent Labs  Lab 02/19/20 1005 02/20/20 0301 02/21/20 0139 02/22/20 0158  AST 39 385* 109* 72*  ALT 18 323* 180* 144*  ALKPHOS 96 150* 121 111  BILITOT 1.6* 2.2* 1.7* 0.8  PROT 6.8 6.5 6.0* 6.5  ALBUMIN 4.1 3.7 3.3* 3.5   Recent Labs  Lab 02/19/20 1005  LIPASE 36   No results for input(s): AMMONIA in the last 168 hours. Coagulation Profile: No results for input(s): INR, PROTIME in the last 168 hours. Cardiac Enzymes: No results for input(s): CKTOTAL, CKMB, CKMBINDEX, TROPONINI in the last 168 hours. BNP (last 3 results) No results for input(s): PROBNP in the last 8760 hours. HbA1C: No results for input(s): HGBA1C in the last 72 hours. CBG: No results for input(s): GLUCAP in the last 168  hours. Lipid Profile: No results for input(s): CHOL, HDL, LDLCALC, TRIG, CHOLHDL, LDLDIRECT in the last 72 hours. Thyroid Function Tests: No results for input(s): TSH, T4TOTAL, FREET4, T3FREE, THYROIDAB in the last 72 hours. Anemia Panel: No results for input(s): VITAMINB12, FOLATE, FERRITIN, TIBC, IRON, RETICCTPCT in the last 72 hours. Urine analysis:    Component Value Date/Time   COLORURINE YELLOW 02/19/2020 1015   APPEARANCEUR CLEAR 02/19/2020 1015   LABSPEC 1.013 02/19/2020 1015   [place] 7.0 02/19/2020 1015   GLUCOSEU NEGATIVE 02/19/2020 1015   HGBUR NEGATIVE 02/19/2020 1015   BILIRUBINUR NEGATIVE 02/19/2020 1015   KETONESUR NEGATIVE 02/19/2020 1015   PROTEINUR NEGATIVE 02/19/2020 1015   NITRITE NEGATIVE 02/19/2020 1015   LEUKOCYTESUR NEGATIVE 02/19/2020 1015   Sepsis Labs: @LABRCNTIP (procalcitonin:4,lacticidven:4)  ) Recent Results (from the past 240 hour(s))  SARS Coronavirus 2 by RT PCR (hospital order, performed in [place] hospital lab) Nasopharyngeal Nasopharyngeal Swab     Status: None   Collection Time: 02/19/20  3:16 PM   Specimen: Nasopharyngeal Swab  Result Value Ref Range Status   SARS Coronavirus 2 NEGATIVE NEGATIVE Final    Comment: (NOTE) SARS-CoV-2 target nucleic acids are NOT DETECTED.  The SARS-CoV-2 RNA is generally detectable in upper and lower respiratory specimens during the acute phase of infection. The lowest concentration of SARS-CoV-2 viral copies this assay can detect is 250 copies / mL. A negative result does not preclude SARS-CoV-2 infection and should not be used as the sole basis for treatment or other patient management decisions.  A negative result may occur with improper specimen collection / handling, submission of specimen other than nasopharyngeal swab, presence of viral mutation(s) within the areas targeted by this assay, and inadequate number of viral copies (<250 copies / mL). A negative result must be combined with  clinical observations, patient history, and epidemiological information.  Fact Sheet for Patients:   StrictlyIdeas.no  Fact Sheet for Healthcare Providers: BankingDealers.co.za  This test is not yet approved or  cleared by the [place] FDA and has been authorized for detection and/or diagnosis of SARS-CoV-2 by FDA under an Emergency Use Authorization (EUA).  This EUA will remain in effect (meaning this test can be used) for the duration of the COVID-19 declaration under Section 564(b)(1) of the Act, 21 U.S.C. section 360bbb-3(b)(1), unless the authorization is terminated or revoked sooner.  Performed at [facility], [place] [address], [place], [place] [address]   Surgical pcr screen     Status: None   Collection Time: 02/21/20 10:11 AM   Specimen: Nasal Mucosa; Nasal Swab  Result Value Ref Range Status   MRSA, PCR NEGATIVE NEGATIVE Final   Staphylococcus aureus NEGATIVE NEGATIVE Final    Comment: (NOTE) The Xpert SA Assay (FDA approved for NASAL specimens in patients 72 years of age and older),  is one component of a comprehensive surveillance program. It is not intended to diagnose infection nor to guide or monitor treatment. Performed at [facility], [place] [address] [place], [place] [address]          Radiology Studies: No results found.      Scheduled Meds: . acetaminophen  1,000 mg Oral Q6H  . enoxaparin (LOVENOX) injection  40 mg Subcutaneous Q24H  . famotidine  20 mg Oral Daily  . potassium chloride  40 mEq Oral Once  . sodium chloride flush  3 mL Intravenous Q12H   Continuous Infusions:    LOS: 3 days    Time spent: 64min  Domenic Polite, MD [facility]  02/22/2020, 2:11 PM

## 2020-02-22 NOTE — Progress Notes (Signed)
1 Day Post-Op  Subjective: CC: Pain overall controlled, sore with coughing. Tolerating PO but does endorse heartburn and mild nausea. Has not been out of bed yet. Voided yesterday after surgery but not yet this AM. Her husband is at bedside.   Objective: Vital signs in last 24 hours: Temp:  [97.6 F (36.4 C)-99.1 F (37.3 C)] 98.2 F (36.8 C) (06/29 0545) Pulse Rate:  [62-99] 82 (06/29 0545) Resp:  [14-25] 16 (06/29 0545) BP: (112-184)/(76-110) 119/85 (06/29 0545) SpO2:  [92 %-100 %] 99 % (06/29 0545) Weight:  [87.5 kg] 87.5 kg (06/28 1010) Last BM Date: 02/21/20  Intake/Output from previous day: 06/28 0701 - 06/29 0700 In: 1560 [P.O.:360; I.V.:1100; IV Piggyback:100] Out: 1205 [Urine:1175; Blood:30] Intake/Output this shift: No intake/output data recorded.  PE: Gen:  Alert, NAD, pleasant Pulm: Normal rate and effort  Abd: Soft, ND, appropriately tender, +BS. Trochar sites c/d/i without drainage or surrounding erythema. Ext:  No LE edema  Psych: A&Ox3  Skin: no rashes noted, warm and dry   Lab Results:  Recent Labs    02/21/20 0139 02/22/20 0158  WBC 4.5 7.3  HGB 11.9* 12.3  HCT 34.8* 36.0  PLT 138* 162   BMET Recent Labs    02/21/20 0139 02/22/20 0158  NA 142 141  K 3.0* 3.3*  CL 111 108  CO2 22 22  GLUCOSE 86 124*  BUN 10 13  CREATININE 0.87 1.19*  CALCIUM 8.5* 8.6*   CMP     Component Value Date/Time   NA 141 02/22/2020 0158   K 3.3 (L) 02/22/2020 0158   CL 108 02/22/2020 0158   CO2 22 02/22/2020 0158   GLUCOSE 124 (H) 02/22/2020 0158   BUN 13 02/22/2020 0158   CREATININE 1.19 (H) 02/22/2020 0158   CALCIUM 8.6 (L) 02/22/2020 0158   PROT 6.5 02/22/2020 0158   ALBUMIN 3.5 02/22/2020 0158   AST 72 (H) 02/22/2020 0158   ALT 144 (H) 02/22/2020 0158   ALKPHOS 111 02/22/2020 0158   BILITOT 0.8 02/22/2020 0158   GFRNONAA 53 (L) 02/22/2020 0158   GFRAA >60 02/22/2020 0158   Lipase     Component Value Date/Time   LIPASE 36 02/19/2020  1005   Studies/Results: No results found.  Anti-infectives: Anti-infectives (From admission, onward)   Start     Dose/Rate Route Frequency Ordered Stop   02/21/20 0845  cefTRIAXone (ROCEPHIN) 2 g in sodium chloride 0.9 % 100 mL IVPB  Status:  Discontinued        2 g 200 mL/hr over 30 Minutes Intravenous Every 24 hours 02/21/20 9833 02/21/20 1454     Assessment/Plan Hypertension Anemia Hx gastric sleeve Hx breast cancers/plumpectomy with chemo and radiation completed in March Endometrial thickening on CT Hypokalemia   Symptomatic Cholelithiasis with likely early Cholecystitis  S/p laparoscopic cholecystectomy 6.28 Dr. Brantley Stage - On CT CBD is dilated CBD and t bili elevated on labs. MRCP was negative for choledocholithiasis.  -  T.bili normalized, AST/ALT down trending  - PO pain control with scheduled tylenol and PRN oxycodone  - patient should ambulate in the hallway this morning. If able to mobilize safely and nausea improving then she is stable for discharge from general surgery perspective, follow up and oxycodone Rx provided.   FEN - regular VTE - Scds, Lovenox ID - perioperative rocephin    LOS: 3 days    Jill Alexanders , [facility] 02/22/2020, 9:38 AM Please see Amion for pager number during day  hours 7:00am-4:30pm

## 2020-02-23 DIAGNOSIS — R109 Unspecified abdominal pain: Secondary | ICD-10-CM

## 2020-02-23 LAB — BASIC METABOLIC PANEL
Anion gap: 7 (ref 5–15)
BUN: 14 mg/dL (ref 6–20)
CO2: 23 mmol/L (ref 22–32)
Calcium: 8.4 mg/dL — ABNORMAL LOW (ref 8.9–10.3)
Chloride: 112 mmol/L — ABNORMAL HIGH (ref 98–111)
Creatinine, Ser: 0.99 mg/dL (ref 0.44–1.00)
GFR calc Af Amer: >60 mL/min (ref >60)
GFR calc non Af Amer: >60 mL/min (ref >60)
Glucose, Bld: 104 mg/dL — ABNORMAL HIGH (ref 70–99)
Potassium: 3.3 mmol/L — ABNORMAL LOW (ref 3.5–5.1)
Sodium: 142 mmol/L (ref 135–145)

## 2020-02-23 LAB — CBC
HCT: 34.1 % — ABNORMAL LOW (ref 36.0–46.0)
Hemoglobin: 11.3 g/dL — ABNORMAL LOW (ref 12.0–15.0)
MCH: 30.7 pg (ref 26.0–34.0)
MCHC: 33.1 g/dL (ref 30.0–36.0)
MCV: 92.7 fL (ref 80.0–100.0)
Platelets: 134 10*3/uL — ABNORMAL LOW (ref 150–400)
RBC: 3.68 MIL/uL — ABNORMAL LOW (ref 3.87–5.11)
RDW: 12.6 % (ref 11.5–15.5)
WBC: 6.4 10*3/uL (ref 4.0–10.5)
nRBC: 0 % (ref 0.0–0.2)

## 2020-02-23 MED ORDER — FAMOTIDINE 20 MG PO TABS: 20.0000 mg | ORAL_TABLET | Freq: Every day | ORAL | 0 refills | Status: AC

## 2020-02-23 MED ORDER — ONDANSETRON HCL 4 MG PO TABS: 4.0000 mg | ORAL_TABLET | Freq: Four times a day (QID) | ORAL | 0 refills | Status: AC | PRN

## 2020-02-23 NOTE — Progress Notes (Signed)
2 Days Post-Op  Subjective: CC: Doing well. No further emesis. Tolerating diet. Some abdominal pain around upper abdominal incisions that is controlled with oral medications. Mobilizing. Voiding without difficulty.   Objective: Vital signs in last 24 hours: Temp:  [98.1 F (36.7 C)-99 F (37.2 C)] 98.1 F (36.7 C) (06/30 0447) Pulse Rate:  [72-92] 72 (06/30 0447) Resp:  [16-20] 16 (06/30 0447) BP: (104-131)/(72-84) 104/78 (06/30 0447) SpO2:  [98 %-99 %] 98 % (06/30 0447) Last BM Date: 02/21/20  Intake/Output from previous day: 06/29 0701 - 06/30 0700 In: 1220 [P.O.:1220] Out: -  Intake/Output this shift: No intake/output data recorded.  PE: Gen:  Alert, NAD, pleasant Pulm: Normal rate and effort  Abd: Soft, ND, appropriately tender around laparoscopic incisions, +BS, Incisions with glue intact appears well and are without drainage, bleeding, or signs of infection Psych: A&Ox3  Skin: no rashes noted, warm and dry   Lab Results:  Recent Labs    02/22/20 0158 02/23/20 0233  WBC 7.3 6.4  HGB 12.3 11.3*  HCT 36.0 34.1*  PLT 162 134*   BMET Recent Labs    02/22/20 0158 02/23/20 0233  NA 141 142  K 3.3* 3.3*  CL 108 112*  CO2 22 23  GLUCOSE 124* 104*  BUN 13 14  CREATININE 1.19* 0.99  CALCIUM 8.6* 8.4*   PT/INR No results for input(s): LABPROT, INR in the last 72 hours. CMP     Component Value Date/Time   NA 142 02/23/2020 0233   K 3.3 (L) 02/23/2020 0233   CL 112 (H) 02/23/2020 0233   CO2 23 02/23/2020 0233   GLUCOSE 104 (H) 02/23/2020 0233   BUN 14 02/23/2020 0233   CREATININE 0.99 02/23/2020 0233   CALCIUM 8.4 (L) 02/23/2020 0233   PROT 6.5 02/22/2020 0158   ALBUMIN 3.5 02/22/2020 0158   AST 72 (H) 02/22/2020 0158   ALT 144 (H) 02/22/2020 0158   ALKPHOS 111 02/22/2020 0158   BILITOT 0.8 02/22/2020 0158   GFRNONAA >60 02/23/2020 0233   GFRAA >60 02/23/2020 0233   Lipase     Component Value Date/Time   LIPASE 36 02/19/2020 1005        Studies/Results: No results found.  Anti-infectives: Anti-infectives (From admission, onward)   Start     Dose/Rate Route Frequency Ordered Stop   02/21/20 0845  cefTRIAXone (ROCEPHIN) 2 g in sodium chloride 0.9 % 100 mL IVPB  Status:  Discontinued        2 g 200 mL/hr over 30 Minutes Intravenous Every 24 hours 02/21/20 3762 02/21/20 1454       Assessment/Plan Hypertension Anemia Hx gastric sleeve Hx breast cancers/plumpectomy with chemo and radiation completed in March Endometrial thickening on CT Hypokalemia   Symptomatic Cholelithiasis with likely early Cholecystitis  S/p laparoscopic cholecystectomy 6.28 Dr. Brantley Stage - POD #2 - On CT CBD is dilated CBD and t bili elevated on labs. MRCP was negative for choledocholithiasis. T.bili normalized, AST/ALT down trending  - PO pain control with scheduled tylenol and PRN oxycodone  - The patient is voiding well, tolerating diet, ambulating well, pain well controlled with oral medications, vital signs stable, incisions c/d/i and felt stable for discharge home. Pain medication has been sent to her pharmacy. A note has been provided for school. Follow up arranged.   FEN - regular VTE - Scds, Lovenox ID - perioperative rocephin    LOS: 4 days    Jillyn Ledger , [facility] 02/23/2020, 9:43  AM Please see Amion for pager number during day hours 7:00am-4:30pm

## 2020-02-23 NOTE — Progress Notes (Signed)
Patient discharged to home. Verbalizes understanding of all discharge instructions including incision care, discharge medications, and follow up MD visits. Patient wheeled to car via wheelchair by volunteers.

## 2020-02-24 NOTE — Discharge Summary (Signed)
Physician Discharge Summary  Andrea [name] NLZ:[id number] DOB: September 16, 1970 DOA: 02/19/2020  PCP: Patient, No Pcp Per  Admit date: 02/19/2020 Discharge date: 02/23/2020  Admitted From: Home.  Disposition:  Home.   Recommendations for Outpatient Follow-up:  1. Follow up with PCP in 1-2 weeks 2. Please obtain BMP/CBC in one week Please follow up with gen surgery as scheduled.    Discharge Condition:STABLE.  CODE STATUS: FULL CODE.  Diet recommendation: Heart Healthy  Brief/Interim Summary: Andrea Whitfieldis a 49 y.o.femalewith medical history significant ofhypertension, anemia,s/pgastric sleeve, breast cancers/plumpectomy with chemo and radiation completed in March, and obesity who presented with upper abdominal pain starting 6/26 morning around 8:45 AM. Reported having nausea with nonbloody emesis.  Ultrasound of the abdomen revealed cholelithiasis with gallbladder thickening. -Underwent lap chole 6/28, POST op she was started on a diet, was able to tolerate. She was discharged home to follow up with PCP.    Discharge Diagnoses:  Principal Problem:   Abdominal pain Active Problems:   History of breast cancer   Cholelithiasis   Hyperbilirubinemia  Symptomatic cholelithiasis -Underwent lap chole , MRCP is negative for choledocholithiasis. t bili is normal. Liver enzymes are improving. Pain control with oxycodone. Able to tolerate diet. Plan for discharge today and recommend outpatient follow up with surgery as scheduled.    History of breast cancer: Patient initially diagnosed in1/2020 status post lumpectomy with chemotherapy and radiation. -Continue outpatient follow-up with hematology oncology at [facility]  S/pgastric sleeve  Discharge Instructions  Discharge Instructions    Diet - low sodium heart healthy   Complete by: As directed    Discharge instructions   Complete by: As directed    Please follow up with PCP in one week.  Please follow up with Gen surgery  as recommended.   Discharge wound care:   Complete by: As directed    As per gen surgery recommendations.   Increase activity slowly   Complete by: As directed      Allergies as of 02/23/2020      Reactions   Strawberry Flavor Anaphylaxis      Medication List    TAKE these medications   famotidine 20 MG tablet Commonly known as: PEPCID Take 1 tablet (20 mg total) by mouth daily.   ferrous sulfate 325 (65 FE) MG tablet Take 1 tablet by mouth every Monday, Wednesday, and Friday.   liraglutide 18 MG/3ML Sopn Commonly known as: VICTOZA Inject 1.2 mg into the skin daily.   multivitamin with minerals Tabs tablet Take 1 tablet by mouth daily.   ondansetron 4 MG tablet Commonly known as: ZOFRAN Take 1 tablet (4 mg total) by mouth every 6 (six) hours as needed for nausea.   oxyCODONE 5 MG immediate release tablet Commonly known as: Oxy IR/ROXICODONE Take 1 tablet (5 mg total) by mouth every 6 (six) hours as needed for moderate pain or severe pain (pain not releived by tylenol).   triamterene-hydrochlorothiazide 37.5-25 MG capsule Commonly known as: DYAZIDE Take 1 capsule by mouth daily.            Discharge Care Instructions  (From admission, onward)         Start     Ordered   02/23/20 0000  Discharge wound care:       Comments: As per gen surgery recommendations.   02/23/20 [facility], [place]. Go on 03/09/2020.   Specialty: General Surgery Why: 07/15 at 2:45  pm. Please arrive to your appointment 30 minutes early for paperwork. Please bring a copy of your photo ID and insurance card.  Contact information: [address] [place] [address] [phone]              Allergies  Allergen Reactions  . Strawberry Flavor Anaphylaxis    Consultations:  General surgery.    Procedures/Studies: US Abdomen Complete  Result Date: 02/19/2020 CLINICAL DATA:  Abdominal and epigastric pain since  0845 hours today, history hypertension and breast cancer EXAM: ABDOMEN ULTRASOUND COMPLETE COMPARISON:  None FINDINGS: Gallbladder: Multiple tiny shadowing calculi within gallbladder, largest measurable calculus 5 mm diameter. Gallbladder wall upper normal thickness. No sonographic Murphy sign or pericholecystic fluid. Common bile duct: Diameter: 8 mm, minimally prominent. Liver: Normal echogenicity without focal mass. Minimal central intrahepatic biliary dilatation. Portal vein is patent on color Doppler imaging with normal direction of blood flow towards the liver. IVC: Normal appearance Pancreas: Portions of tail obscured by bowel gas. Visualized portions normal appearance. Spleen: Normal appearance, 6.9 cm length Right Kidney: Length: 9.8 cm. Normal morphology without mass or hydronephrosis. Left Kidney: Length: 9.8 cm. Normal morphology without mass or hydronephrosis. Abdominal aorta: Normal caliber proximally, mid to distal portions obscured by bowel gas Other findings: No free fluid IMPRESSION: Cholelithiasis with upper normal gallbladder wall thickness. Mildly prominent CBD with minimal central intrahepatic biliary dilatation, recommend correlation with LFTs. Electronically Signed   By: Lavonia Dana M.D.   On: 02/19/2020 13:16   CT ABDOMEN PELVIS W CONTRAST  Result Date: 02/19/2020 CLINICAL DATA:  Cholecystitis. EXAM: CT ABDOMEN AND PELVIS WITH CONTRAST TECHNIQUE: Multidetector CT imaging of the abdomen and pelvis was performed using the standard protocol following bolus administration of intravenous contrast. CONTRAST:  138mL OMNIPAQUE IOHEXOL 300 MG/ML  SOLN COMPARISON:  None recent. FINDINGS: Lower chest: The lung bases are clear. The heart is enlarged. Hepatobiliary: The patient's prior CT and MRI were not available for comparison. There is a 2.2 cm hypoattenuating lesion in hepatic segment 6 (axial series 3, image 24). This presumably represents the previously documented hepatic hemangioma.  Cholelithiasis without acute inflammation.The common bile duct is dilated measuring approximately 9 mm distally. Pancreas: Normal contours without ductal dilatation. No peripancreatic fluid collection. Spleen: The spleen is borderline enlarged measuring approximately 12 cm. Adrenals/Urinary Tract: --Adrenal glands: Unremarkable. --Right kidney/ureter: No hydronephrosis or radiopaque kidney stones. --Left kidney/ureter: No hydronephrosis or radiopaque kidney stones. --Urinary bladder: The bladder is underdistended. There does appear to be some diffuse bladder wall thickening. Stomach/Bowel: --Stomach/Duodenum: There are postsurgical changes of the stomach. --Small bowel: Unremarkable. --Colon: Unremarkable. --Appendix: Normal. Vascular/Lymphatic: Atherosclerotic calcification is present within the non-aneurysmal abdominal aorta, without hemodynamically significant stenosis. --No retroperitoneal lymphadenopathy. --No mesenteric lymphadenopathy. --No pelvic or inguinal lymphadenopathy. Reproductive: There is apparent diffuse thickening of the endometrium. Other: No ascites or free air. There is a fat containing umbilical hernia. Musculoskeletal. No acute displaced fractures. IMPRESSION: 1. Cholelithiasis without acute inflammation. 2. Dilated common bile duct measuring up to 9 mm distally. Correlation with laboratory studies is recommended. If there is concern for choledocholithiasis, follow-up with MRCP or ERCP is recommended. 3. Apparent diffuse thickening of the endometrium. Recommend further evaluation with nonemergent outpatient pelvic ultrasound. 4. Bladder wall thickening. Correlation with urinalysis is recommended. 5. Cardiomegaly. Aortic Atherosclerosis (ICD10-I70.0). Electronically Signed   By: Constance Holster M.D.   On: 02/19/2020 17:09   DG Abd 2 Views  Result Date: 02/19/2020 CLINICAL DATA:  Upper abdominal pain since 8:45 a.m. EXAM:  ABDOMEN - 2 VIEW COMPARISON:  None. FINDINGS: Normal bowel gas  pattern without free peritoneal air. Bilateral pelvic phleboliths. Mild scoliosis. Lumbar and lower thoracic spine degenerative changes. IMPRESSION: No acute abnormality. Electronically Signed   By: Claudie Revering M.D.   On: 02/19/2020 12:13   MR ABDOMEN MRCP W WO CONTAST  Result Date: 02/23/2020 CLINICAL DATA:  Abdominal pain. Cholelithiasis. Biliary ductal dilatation on recent ultrasound and CT EXAM: MRI ABDOMEN WITHOUT AND WITH CONTRAST (INCLUDING MRCP) TECHNIQUE: Multiplanar multisequence MR imaging of the abdomen was performed both before and after the administration of intravenous contrast. Heavily T2-weighted images of the biliary and pancreatic ducts were obtained, and three-dimensional MRCP images were rendered by post processing. CONTRAST:  8.57mL GADAVIST GADOBUTROL 1 MMOL/ML IV SOLN COMPARISON:  Ultrasound and CT on 02/19/2020 FINDINGS: Lower chest: No acute findings. Hepatobiliary: A 1.7 cm lesion is seen in the posterior right hepatic lobe which shows moderate T2 hyperintensity and mild peripheral nodular contrast enhancement, although there is no evidence of progressive central enhancement. This was shown to represent a benign hemangioma on prior MRI in 2014, and the has decreased in size compared to 3.2 cm on the prior exam. No new or enlarging liver lesions are identified. Numerous small gallstones are seen filling the gallbladder, however there is no evidence of cholecystitis. The proximal common bile duct measures 5 mm, which is within normal limits, and there is no evidence of choledocholithiasis or biliary stricture. Pancreas: No mass or inflammatory changes. No evidence of pancreatic ductal dilatation or pancreas divisum. Spleen:  Within normal limits in size and appearance. Adrenals/Urinary Tract: No masses identified. No evidence of hydronephrosis. Stomach/Bowel: Visualized portion unremarkable. Vascular/Lymphatic: No pathologically enlarged lymph nodes identified. No abdominal aortic  aneurysm. Other:  None. Musculoskeletal:  No suspicious bone lesions identified. IMPRESSION: 1. Cholelithiasis. No radiographic evidence of cholecystitis or other acute findings. 2. No evidence of biliary ductal dilatation or choledocholithiasis. 3. 1.7 cm benign hemangioma in the posterior right hepatic lobe, decreased in size compared to prior MRI in 2014. Electronically Signed   By: Marlaine Hind M.D.   On: 02/23/2020 08:57     Subjective: No new complaints.   Discharge Exam: Vitals:   02/23/20 0447 02/23/20 1219  BP: 104/78 (!) 144/85  Pulse: 72 85  Resp: 16 17  Temp: 98.1 F (36.7 C)   SpO2: 98% 100%   Vitals:   02/22/20 1331 02/22/20 2000 02/23/20 0447 02/23/20 1219  BP: 131/84 114/72 104/78 (!) 144/85  Pulse: 89 87 72 85  Resp: 16 17 16 17   Temp: 99 F (37.2 C) 99 F (37.2 C) 98.1 F (36.7 C)   TempSrc: Oral Oral Oral   SpO2: 98% 99% 98% 100%  Weight:      Height:        General: Pt is alert, awake, not in acute distress Cardiovascular: RRR, S1/S2 +, no rubs, no gallops Respiratory: CTA bilaterally, no wheezing, no rhonchi Abdominal: Soft, NT, ND, bowel sounds + Extremities: no edema, no cyanosis    The results of significant diagnostics from this hospitalization (including imaging, microbiology, ancillary and laboratory) are listed below for reference.     Microbiology: Recent Results (from the past 240 hour(s))  SARS Coronavirus 2 by RT PCR (hospital order, performed in [facility] hospital lab) Nasopharyngeal Nasopharyngeal Swab     Status: None   Collection Time: 02/19/20  3:16 PM   Specimen: Nasopharyngeal Swab  Result Value Ref Range Status   SARS Coronavirus 2 NEGATIVE NEGATIVE  Final    Comment: (NOTE) SARS-CoV-2 target nucleic acids are NOT DETECTED.  The SARS-CoV-2 RNA is generally detectable in upper and lower respiratory specimens during the acute phase of infection. The lowest concentration of SARS-CoV-2 viral copies this assay can detect is  250 copies / mL. A negative result does not preclude SARS-CoV-2 infection and should not be used as the sole basis for treatment or other patient management decisions.  A negative result may occur with improper specimen collection / handling, submission of specimen other than nasopharyngeal swab, presence of viral mutation(s) within the areas targeted by this assay, and inadequate number of viral copies (<250 copies / mL). A negative result must be combined with clinical observations, patient history, and epidemiological information.  Fact Sheet for Patients:   StrictlyIdeas.no  Fact Sheet for Healthcare Providers: BankingDealers.co.za  This test is not yet approved or  cleared by the [place] FDA and has been authorized for detection and/or diagnosis of SARS-CoV-2 by FDA under an Emergency Use Authorization (EUA).  This EUA will remain in effect (meaning this test can be used) for the duration of the COVID-19 declaration under Section 564(b)(1) of the Act, 21 U.S.C. section 360bbb-3(b)(1), unless the authorization is terminated or revoked sooner.  Performed at [facility], [place] [address], [place], [place] [address]   Surgical pcr screen     Status: None   Collection Time: 02/21/20 10:11 AM   Specimen: Nasal Mucosa; Nasal Swab  Result Value Ref Range Status   MRSA, PCR NEGATIVE NEGATIVE Final   Staphylococcus aureus NEGATIVE NEGATIVE Final    Comment: (NOTE) The Xpert SA Assay (FDA approved for NASAL specimens in patients 30 years of age and older), is one component of a comprehensive surveillance program. It is not intended to diagnose infection nor to guide or monitor treatment. Performed at [facility], [place] [address] [place], [place] [address]      Labs: BNP (last 3 results) No results for input(s): BNP in the last 8760 hours. Basic Metabolic Panel: Recent Labs  Lab 02/19/20 1005 02/20/20 0301  02/21/20 0139 02/22/20 0158 02/23/20 0233  NA 141 138 142 141 142  K 3.2* 3.1* 3.0* 3.3* 3.3*  CL 106 106 111 108 112*  CO2 25 22 22 22 23   GLUCOSE 102* 92 86 124* 104*  BUN 13 9 10 13 14   CREATININE 0.87 1.04* 0.87 1.19* 0.99  CALCIUM 8.9 8.8* 8.5* 8.6* 8.4*   Liver Function Tests: Recent Labs  Lab 02/19/20 1005 02/20/20 0301 02/21/20 0139 02/22/20 0158  AST 39 385* 109* 72*  ALT 18 323* 180* 144*  ALKPHOS 96 150* 121 111  BILITOT 1.6* 2.2* 1.7* 0.8  PROT 6.8 6.5 6.0* 6.5  ALBUMIN 4.1 3.7 3.3* 3.5   Recent Labs  Lab 02/19/20 1005  LIPASE 36   No results for input(s): AMMONIA in the last 168 hours. CBC: Recent Labs  Lab 02/19/20 1005 02/20/20 0301 02/21/20 0139 02/22/20 0158 02/23/20 0233  WBC 4.4 5.2 4.5 7.3 6.4  HGB 13.4 12.8 11.9* 12.3 11.3*  HCT 39.1 36.5 34.8* 36.0 34.1*  MCV 90.7 89.0 90.4 90.2 92.7  PLT 170 157 138* 162 134*   Cardiac Enzymes: No results for input(s): CKTOTAL, CKMB, CKMBINDEX, TROPONINI in the last 168 hours. BNP: Invalid input(s): POCBNP CBG: No results for input(s): GLUCAP in the last 168 hours. D-Dimer No results for input(s): DDIMER in the last 72 hours. Hgb A1c No results for input(s): HGBA1C in the  last 72 hours. Lipid Profile No results for input(s): CHOL, HDL, LDLCALC, TRIG, CHOLHDL, LDLDIRECT in the last 72 hours. Thyroid function studies No results for input(s): TSH, T4TOTAL, T3FREE, THYROIDAB in the last 72 hours.  Invalid input(s): FREET3 Anemia work up No results for input(s): VITAMINB12, FOLATE, FERRITIN, TIBC, IRON, RETICCTPCT in the last 72 hours. Urinalysis    Component Value Date/Time   COLORURINE YELLOW 02/19/2020 1015   APPEARANCEUR CLEAR 02/19/2020 1015   LABSPEC 1.013 02/19/2020 1015   [place] 7.0 02/19/2020 1015   GLUCOSEU NEGATIVE 02/19/2020 1015   HGBUR NEGATIVE 02/19/2020 1015   [place] NEGATIVE 02/19/2020 1015   [place] 02/19/2020 1015   PROTEINUR NEGATIVE 02/19/2020 1015    NITRITE NEGATIVE 02/19/2020 1015   LEUKOCYTESUR NEGATIVE 02/19/2020 1015   Sepsis Labs Invalid input(s): PROCALCITONIN,  WBC,  LACTICIDVEN Microbiology Recent Results (from the past 240 hour(s))  SARS Coronavirus 2 by RT PCR (hospital order, performed in [place] hospital lab) Nasopharyngeal Nasopharyngeal Swab     Status: None   Collection Time: 02/19/20  3:16 PM   Specimen: Nasopharyngeal Swab  Result Value Ref Range Status   SARS Coronavirus 2 NEGATIVE NEGATIVE Final    Comment: (NOTE) SARS-CoV-2 target nucleic acids are NOT DETECTED.  The SARS-CoV-2 RNA is generally detectable in upper and lower respiratory specimens during the acute phase of infection. The lowest concentration of SARS-CoV-2 viral copies this assay can detect is 250 copies / mL. A negative result does not preclude SARS-CoV-2 infection and should not be used as the sole basis for treatment or other patient management decisions.  A negative result may occur with improper specimen collection / handling, submission of specimen other than nasopharyngeal swab, presence of viral mutation(s) within the areas targeted by this assay, and inadequate number of viral copies (<250 copies / mL). A negative result must be combined with clinical observations, patient history, and epidemiological information.  Fact Sheet for Patients:   StrictlyIdeas.no  Fact Sheet for Healthcare Providers: BankingDealers.co.za  This test is not yet approved or  cleared by the [place] FDA and has been authorized for detection and/or diagnosis of SARS-CoV-2 by FDA under an Emergency Use Authorization (EUA).  This EUA will remain in effect (meaning this test can be used) for the duration of the COVID-19 declaration under Section 564(b)(1) of the Act, 21 U.S.C. section 360bbb-3(b)(1), unless the authorization is terminated or revoked sooner.  Performed at [facility], [place]  [address], [place], [place] [address]   Surgical pcr screen     Status: None   Collection Time: 02/21/20 10:11 AM   Specimen: Nasal Mucosa; Nasal Swab  Result Value Ref Range Status   MRSA, PCR NEGATIVE NEGATIVE Final   Staphylococcus aureus NEGATIVE NEGATIVE Final    Comment: (NOTE) The Xpert SA Assay (FDA approved for NASAL specimens in patients 8 years of age and older), is one component of a comprehensive surveillance program. It is not intended to diagnose infection nor to guide or monitor treatment. Performed at [facility], [place] [address] [place],  [address]      Time coordinating discharge: 34 minutes.   SIGNED:   Hosie Poisson, MD  [facility] 02/24/2020, 5:00 PM

## 2021-01-02 IMAGING — MR MR ABDOMEN WO/W CM MRCP
19 of 22 series · 44 of 48 positions shown · IV contrast (gadavist)
Comparison: Ultrasound and CT on 02/19/2020

CLINICAL DATA: Abdominal pain. Cholelithiasis. Biliary ductal
dilatation on recent ultrasound and CT

EXAM:
MRI ABDOMEN WITHOUT AND WITH CONTRAST (INCLUDING MRCP)
TECHNIQUE: Multiplanar multisequence MR imaging of the abdomen was performed
both before and after the administration of intravenous contrast.
Heavily T2-weighted images of the biliary and pancreatic ducts were
obtained, and three-dimensional MRCP images were rendered by post
processing.
CONTRAST:  8.5mL GADAVIST GADOBUTROL 1 MMOL/ML IV SOLN

[Series 4: bSSFP · coronal · 6.0mm · 0.74mm/px · 1 of 32 slices shown]
[im 1/32]
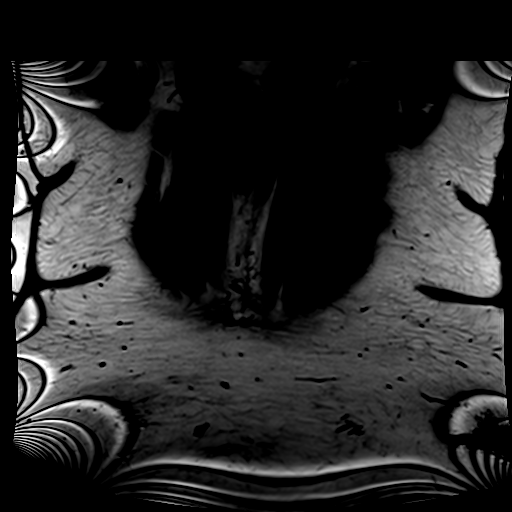

[Series 5: ax haste · axial · 6.0mm · 1.19mm/px · 1 of 34 slices shown]
[im 1/34]
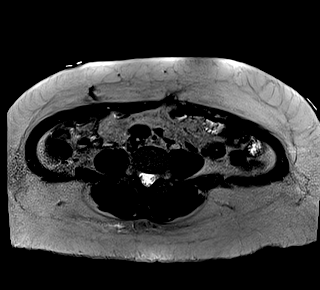

[Series 8: T2 fat-sat · axial · 6.0mm · 1.19mm/px · 1 of 32 slices shown]
[im 1/32]
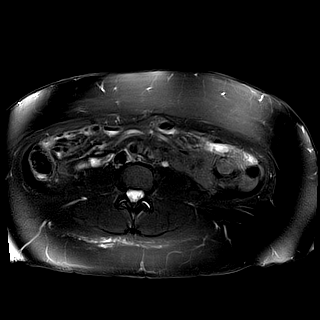

[Series 17: DWI · axial · 6.0mm · 1.42mm/px · z∈[-129,+108]mm · 3 of 102 slices shown (1 of 2)]
[im 1/102]
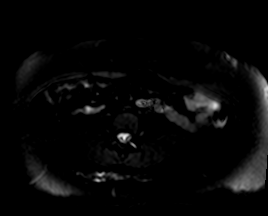
[im 51/102]
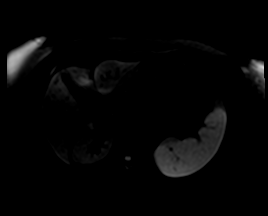
[im 102/102]
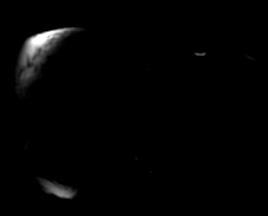

[Series 18: DWI · axial · 6.0mm · 1.42mm/px · 1 of 34 slices shown (2 of 2)]
[im 1/34]
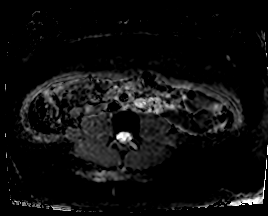

[Series 19: ax in and · axial · 3.0mm · 1.19mm/px · z∈[-143,+94]mm · 2 of 80 slices shown (1 of 2)]
[im 1/80]
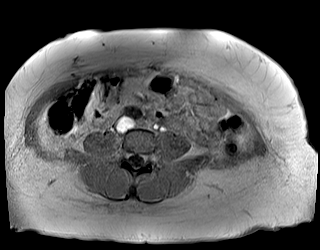
[im 80/80]
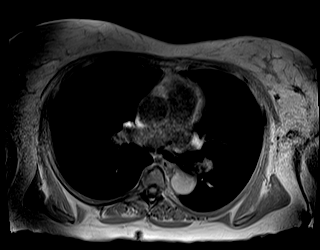

[Series 19: ax in and · axial · 3.0mm · 1.19mm/px · z∈[-143,+94]mm · 3 of 80 slices shown (2 of 2)]
[im 1/80]
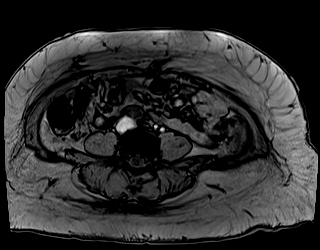
[im 40/80]
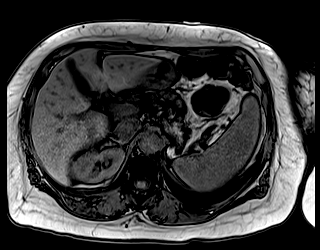
[im 80/80]
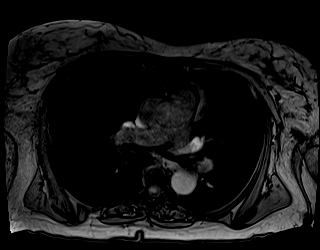

[Series 20: MRCP · coronal · 4.0mm · 1.12mm/px · 1 of 16 slices shown]
[im 1/16]
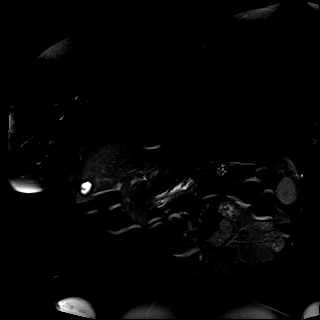

[Series 21: radials · oblique · 50.0mm · 0.78mm/px · 1 of 5 slices shown]
[im 1/5]
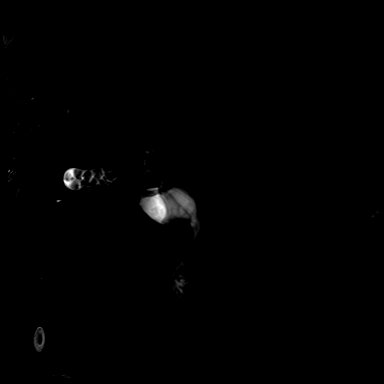

[Series 22: T1 dynamic · axial · non-contrast · 3.0mm · 1.19mm/px · z∈[-159,+78]mm · 3 of 80 slices shown (1 of 5)]
[im 1/80]
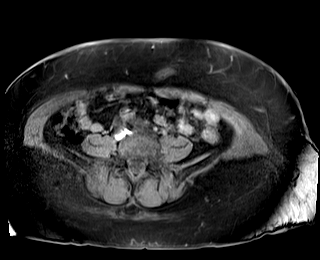
[im 40/80]
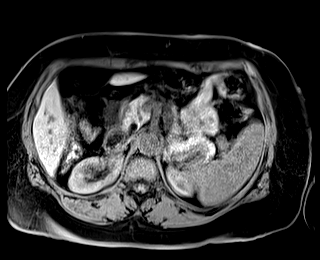
[im 80/80]
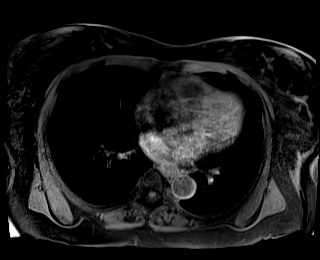

[Series 24: T1 dynamic post-contrast · axial · 3.0mm · 1.19mm/px · z∈[-159,+78]mm · 3 of 80 slices shown (1 of 5)]
[im 1/80]
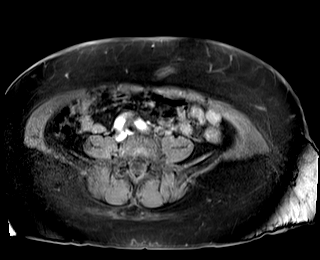
[im 40/80]
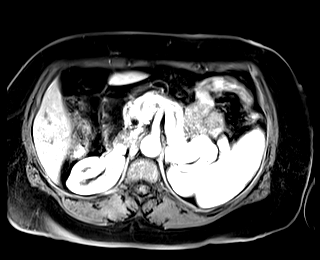
[im 80/80]
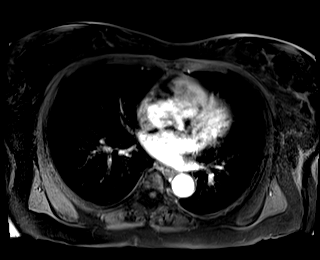

[Series 25: T1 dynamic · axial · 3.0mm · 1.19mm/px · z∈[-159,+78]mm · 3 of 80 slices shown (2 of 5)]
[im 1/80]
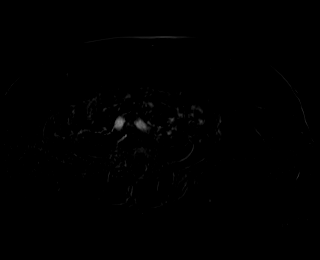
[im 40/80]
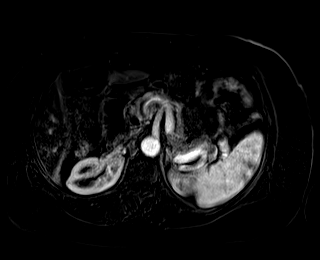
[im 80/80]
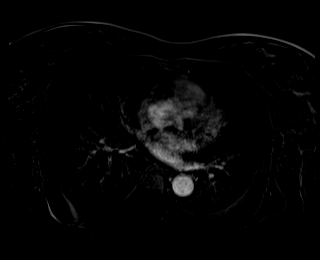

[Series 26: T1 dynamic post-contrast · axial · 3.0mm · 1.19mm/px · z∈[-159,+78]mm · 3 of 80 slices shown (2 of 5)]
[im 1/80]
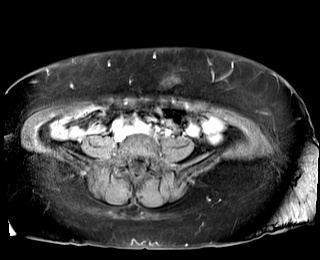
[im 40/80]
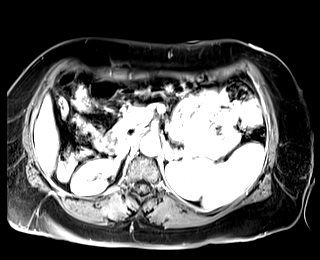
[im 80/80]
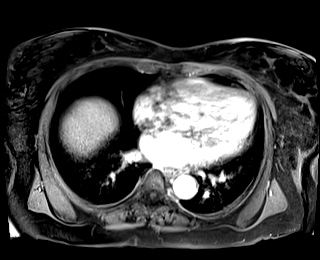

[Series 27: T1 dynamic · axial · 3.0mm · 1.19mm/px · z∈[-159,+78]mm · 3 of 80 slices shown (3 of 5)]
[im 1/80]
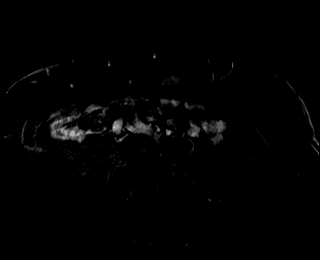
[im 40/80]
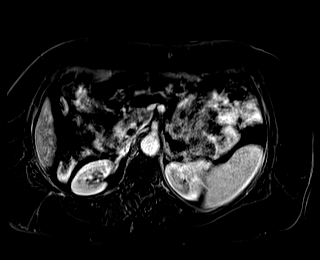
[im 80/80]
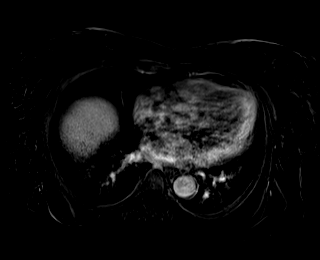

[Series 28: T1 dynamic post-contrast · axial · 3.0mm · 1.19mm/px · z∈[-159,+78]mm · 3 of 80 slices shown (3 of 5)]
[im 1/80]
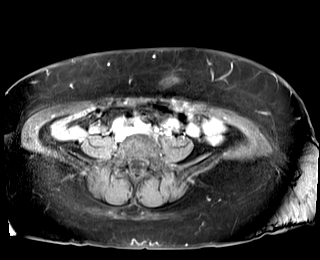
[im 40/80]
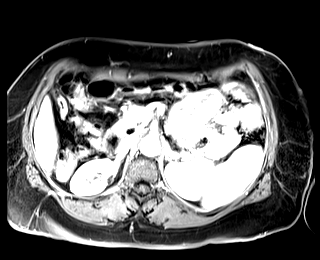
[im 80/80]
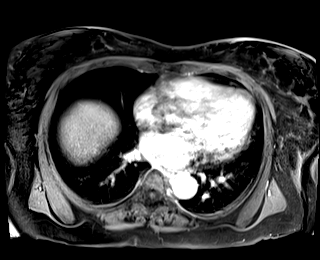

[Series 29: T1 dynamic · axial · 3.0mm · 1.19mm/px · z∈[-159,+78]mm · 3 of 80 slices shown (4 of 5)]
[im 1/80]
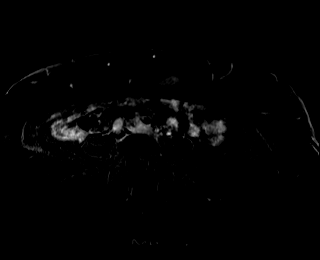
[im 40/80]
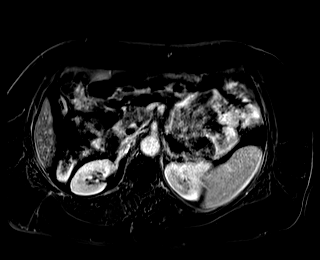
[im 80/80]
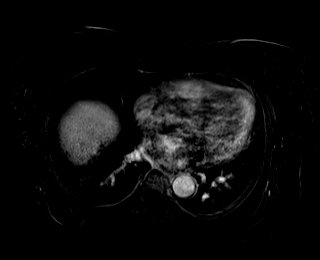

[Series 30: T1 dynamic post-contrast · axial · 3.0mm · 1.19mm/px · z∈[-159,+78]mm · 3 of 80 slices shown (4 of 5)]
[im 1/80]
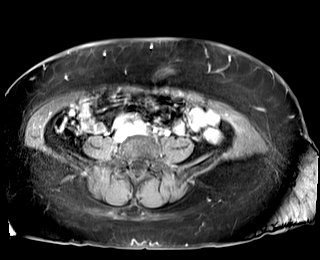
[im 40/80]
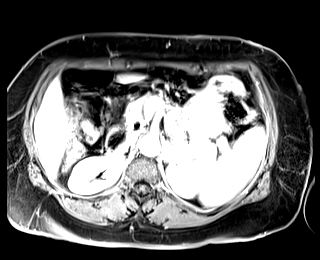
[im 80/80]
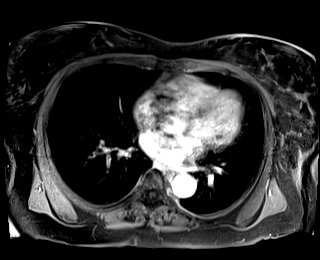

[Series 31: T1 dynamic · axial · 3.0mm · 1.19mm/px · z∈[-159,+78]mm · 3 of 80 slices shown (5 of 5)]
[im 1/80]
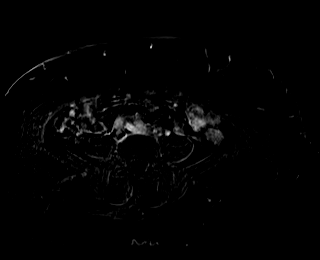
[im 40/80]
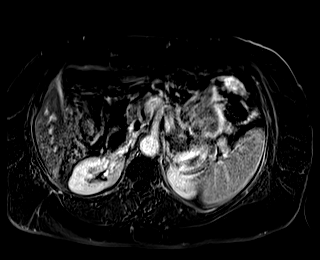
[im 80/80]
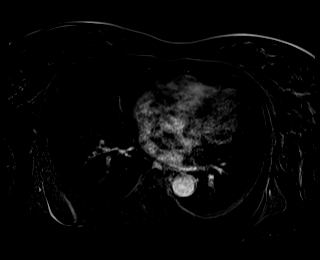

[Series 33: T1 dynamic post-contrast · coronal · 3.0mm · 1.31mm/px · 3 of 72 slices shown (5 of 5)]
[im 1/72]
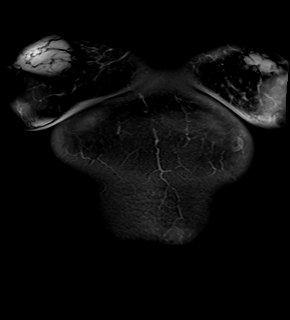
[im 36/72]
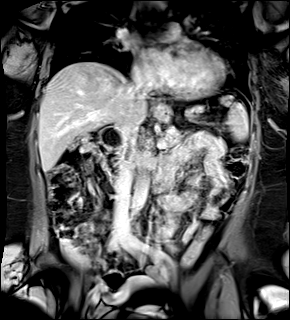
[im 72/72]
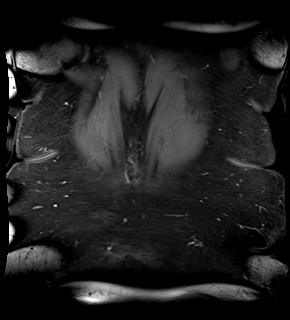

[44 of 48 positions shown; findings below may reference images not displayed]

FINDINGS: Lower chest: No acute findings.

Hepatobiliary: A 1.7 cm lesion is seen in the posterior right
hepatic lobe which shows moderate T2 hyperintensity and mild
peripheral nodular contrast enhancement, although there is no
evidence of progressive central enhancement. This was shown to
represent a benign hemangioma on prior MRI in 7296, and the has
decreased in size compared to 3.2 cm on the prior exam. No new or
enlarging liver lesions are identified.

Numerous small gallstones are seen filling the gallbladder, however
there is no evidence of cholecystitis. The proximal common bile duct
measures 5 mm, which is within normal limits, and there is no
evidence of choledocholithiasis or biliary stricture.

Pancreas: No mass or inflammatory changes. No evidence of pancreatic
ductal dilatation or pancreas divisum.

Spleen:  Within normal limits in size and appearance.

Adrenals/Urinary Tract: No masses identified. No evidence of
hydronephrosis.

Stomach/Bowel: Visualized portion unremarkable.

Vascular/Lymphatic: No pathologically enlarged lymph nodes
identified. No abdominal aortic aneurysm.

Other:  None.

Musculoskeletal:  No suspicious bone lesions identified.
IMPRESSION: 1. Cholelithiasis. No radiographic evidence of cholecystitis or
other acute findings.
2. No evidence of biliary ductal dilatation or choledocholithiasis.
3. 1.7 cm benign hemangioma in the posterior right hepatic lobe,
decreased in size compared to prior MRI in [DATE].

## 2022-05-29 DIAGNOSIS — Z171 Estrogen receptor negative status [ER-]: Secondary | ICD-10-CM | POA: Diagnosis not present

## 2022-05-29 DIAGNOSIS — C50112 Malignant neoplasm of central portion of left female breast: Secondary | ICD-10-CM | POA: Diagnosis not present

## 2022-06-11 DIAGNOSIS — Z0289 Encounter for other administrative examinations: Secondary | ICD-10-CM | POA: Diagnosis not present

## 2022-06-11 DIAGNOSIS — Z9221 Personal history of antineoplastic chemotherapy: Secondary | ICD-10-CM | POA: Diagnosis not present

## 2022-06-11 DIAGNOSIS — Z9889 Other specified postprocedural states: Secondary | ICD-10-CM | POA: Diagnosis not present

## 2022-06-11 DIAGNOSIS — Z0001 Encounter for general adult medical examination with abnormal findings: Secondary | ICD-10-CM | POA: Diagnosis not present

## 2022-06-11 DIAGNOSIS — Z853 Personal history of malignant neoplasm of breast: Secondary | ICD-10-CM | POA: Diagnosis not present

## 2022-07-11 DIAGNOSIS — I10 Essential (primary) hypertension: Secondary | ICD-10-CM | POA: Diagnosis not present

## 2022-07-11 DIAGNOSIS — Z9884 Bariatric surgery status: Secondary | ICD-10-CM | POA: Diagnosis not present

## 2023-08-28 DIAGNOSIS — Z9884 Bariatric surgery status: Secondary | ICD-10-CM | POA: Diagnosis not present

## 2023-08-28 DIAGNOSIS — I509 Heart failure, unspecified: Secondary | ICD-10-CM | POA: Diagnosis not present

## 2023-08-28 DIAGNOSIS — Z1383 Encounter for screening for respiratory disorder NEC: Secondary | ICD-10-CM | POA: Diagnosis not present

## 2023-08-28 DIAGNOSIS — Z1389 Encounter for screening for other disorder: Secondary | ICD-10-CM | POA: Diagnosis not present

## 2023-08-28 DIAGNOSIS — I110 Hypertensive heart disease with heart failure: Secondary | ICD-10-CM | POA: Diagnosis not present

## 2024-01-30 ENCOUNTER — Other Ambulatory Visit: Payer: Self-pay | Admitting: Family

## 2024-01-30 ENCOUNTER — Ambulatory Visit
Admission: RE | Admit: 2024-01-30 | Discharge: 2024-01-30 | Disposition: A | Discharge status: Home / Self Care | Source: Ambulatory Visit | Attending: Family | Admitting: Family

## 2024-01-30 DIAGNOSIS — M1711 Unilateral primary osteoarthritis, right knee: Secondary | ICD-10-CM

## 2024-01-30 DIAGNOSIS — M79671 Pain in right foot: Secondary | ICD-10-CM

## 2024-03-31 ENCOUNTER — Ambulatory Visit: Admitting: Podiatry
